# Patient Record
Sex: Female | Born: 1935 | Race: White | Hispanic: No | Marital: Single | Smoking: Never smoker
Health system: Southern US, Community
[De-identification: ages and names within clinical notes are randomized; demographics above are authoritative.]

## PROBLEM LIST (undated history)

## (undated) DIAGNOSIS — F329 Major depressive disorder, single episode, unspecified: Secondary | ICD-10-CM

## (undated) DIAGNOSIS — E7800 Pure hypercholesterolemia, unspecified: Secondary | ICD-10-CM

## (undated) DIAGNOSIS — E039 Hypothyroidism, unspecified: Secondary | ICD-10-CM

## (undated) DIAGNOSIS — D649 Anemia, unspecified: Secondary | ICD-10-CM

## (undated) DIAGNOSIS — F32A Depression, unspecified: Secondary | ICD-10-CM

## (undated) DIAGNOSIS — R0602 Shortness of breath: Secondary | ICD-10-CM

## (undated) DIAGNOSIS — I10 Essential (primary) hypertension: Secondary | ICD-10-CM

## (undated) DIAGNOSIS — F419 Anxiety disorder, unspecified: Secondary | ICD-10-CM

## (undated) HISTORY — PX: APPENDECTOMY: SHX54

## (undated) HISTORY — PX: ABDOMINAL HYSTERECTOMY: SHX81

## (undated) HISTORY — PX: OTHER SURGICAL HISTORY: SHX169

---

## 2012-08-29 ENCOUNTER — Inpatient Hospital Stay (HOSPITAL_COMMUNITY)
Admission: EM | Admit: 2012-08-29 | Discharge: 2012-09-01 | DRG: 175 | Disposition: A | Discharge status: Home Health | Payer: Medicare Other | Attending: Internal Medicine | Admitting: Internal Medicine

## 2012-08-29 ENCOUNTER — Inpatient Hospital Stay (HOSPITAL_COMMUNITY): Payer: Medicare Other

## 2012-08-29 ENCOUNTER — Encounter (HOSPITAL_COMMUNITY): Payer: Self-pay | Admitting: *Deleted

## 2012-08-29 ENCOUNTER — Emergency Department (HOSPITAL_COMMUNITY): Payer: Medicare Other

## 2012-08-29 DIAGNOSIS — I509 Heart failure, unspecified: Secondary | ICD-10-CM

## 2012-08-29 DIAGNOSIS — N182 Chronic kidney disease, stage 2 (mild): Secondary | ICD-10-CM | POA: Diagnosis present

## 2012-08-29 DIAGNOSIS — J9600 Acute respiratory failure, unspecified whether with hypoxia or hypercapnia: Secondary | ICD-10-CM | POA: Diagnosis present

## 2012-08-29 DIAGNOSIS — Z7901 Long term (current) use of anticoagulants: Secondary | ICD-10-CM

## 2012-08-29 DIAGNOSIS — M19019 Primary osteoarthritis, unspecified shoulder: Secondary | ICD-10-CM | POA: Diagnosis present

## 2012-08-29 DIAGNOSIS — D509 Iron deficiency anemia, unspecified: Secondary | ICD-10-CM | POA: Diagnosis present

## 2012-08-29 DIAGNOSIS — E039 Hypothyroidism, unspecified: Secondary | ICD-10-CM | POA: Diagnosis present

## 2012-08-29 DIAGNOSIS — Z79899 Other long term (current) drug therapy: Secondary | ICD-10-CM

## 2012-08-29 DIAGNOSIS — E785 Hyperlipidemia, unspecified: Secondary | ICD-10-CM | POA: Diagnosis present

## 2012-08-29 DIAGNOSIS — D649 Anemia, unspecified: Secondary | ICD-10-CM | POA: Diagnosis present

## 2012-08-29 DIAGNOSIS — R0600 Dyspnea, unspecified: Secondary | ICD-10-CM

## 2012-08-29 DIAGNOSIS — I10 Essential (primary) hypertension: Secondary | ICD-10-CM | POA: Diagnosis present

## 2012-08-29 DIAGNOSIS — F411 Generalized anxiety disorder: Secondary | ICD-10-CM | POA: Diagnosis present

## 2012-08-29 DIAGNOSIS — F329 Major depressive disorder, single episode, unspecified: Secondary | ICD-10-CM | POA: Diagnosis present

## 2012-08-29 DIAGNOSIS — I2699 Other pulmonary embolism without acute cor pulmonale: Secondary | ICD-10-CM | POA: Diagnosis present

## 2012-08-29 DIAGNOSIS — Z66 Do not resuscitate: Secondary | ICD-10-CM | POA: Diagnosis present

## 2012-08-29 DIAGNOSIS — R0609 Other forms of dyspnea: Secondary | ICD-10-CM | POA: Diagnosis present

## 2012-08-29 DIAGNOSIS — F3289 Other specified depressive episodes: Secondary | ICD-10-CM | POA: Diagnosis present

## 2012-08-29 HISTORY — DX: Hypothyroidism, unspecified: E03.9

## 2012-08-29 HISTORY — DX: Anemia, unspecified: D64.9

## 2012-08-29 HISTORY — DX: Major depressive disorder, single episode, unspecified: F32.9

## 2012-08-29 HISTORY — DX: Essential (primary) hypertension: I10

## 2012-08-29 HISTORY — DX: Depression, unspecified: F32.A

## 2012-08-29 HISTORY — DX: Shortness of breath: R06.02

## 2012-08-29 HISTORY — DX: Pure hypercholesterolemia, unspecified: E78.00

## 2012-08-29 HISTORY — DX: Anxiety disorder, unspecified: F41.9

## 2012-08-29 LAB — RETICULOCYTES
Retic Count, Absolute: 165.6 10*3/uL (ref 19.0–186.0)
Retic Ct Pct: 4.1 % — ABNORMAL HIGH (ref 0.4–3.1)

## 2012-08-29 LAB — CBC WITH DIFFERENTIAL/PLATELET
Basophils Relative: 0 % (ref 0–1)
Eosinophils Relative: 0 % (ref 0–5)
HCT: 29.2 % — ABNORMAL LOW (ref 36.0–46.0)
Hemoglobin: 8.4 g/dL — ABNORMAL LOW (ref 12.0–15.0)
Lymphocytes Relative: 7 % — ABNORMAL LOW (ref 12–46)
MCH: 20.2 pg — ABNORMAL LOW (ref 26.0–34.0)
Neutro Abs: 11 10*3/uL — ABNORMAL HIGH (ref 1.7–7.7)
Neutrophils Relative %: 86 % — ABNORMAL HIGH (ref 43–77)
RBC: 4.15 MIL/uL (ref 3.87–5.11)

## 2012-08-29 LAB — COMPREHENSIVE METABOLIC PANEL
ALT: 35 U/L (ref 0–35)
AST: 20 U/L (ref 0–37)
Alkaline Phosphatase: 87 U/L (ref 39–117)
CO2: 22 mEq/L (ref 19–32)
Chloride: 102 mEq/L (ref 96–112)
GFR calc non Af Amer: 43 mL/min — ABNORMAL LOW (ref >90)
Glucose, Bld: 149 mg/dL — ABNORMAL HIGH (ref 70–99)
Sodium: 139 mEq/L (ref 135–145)
Total Bilirubin: 0.5 mg/dL (ref 0.3–1.2)

## 2012-08-29 LAB — URINALYSIS, ROUTINE W REFLEX MICROSCOPIC
Glucose, UA: NEGATIVE mg/dL
Leukocytes, UA: NEGATIVE
Protein, ur: 30 mg/dL — AB
Specific Gravity, Urine: 1.013 (ref 1.005–1.030)
pH: 6 (ref 5.0–8.0)

## 2012-08-29 LAB — OCCULT BLOOD, POC DEVICE: Fecal Occult Bld: NEGATIVE

## 2012-08-29 LAB — URINE MICROSCOPIC-ADD ON

## 2012-08-29 MED ORDER — ACETAMINOPHEN 650 MG RE SUPP: 650.0000 mg | Freq: Four times a day (QID) | RECTAL | Status: DC | PRN

## 2012-08-29 MED ORDER — SODIUM CHLORIDE 0.9 % IJ SOLN
3.0000 mL | Freq: Two times a day (BID) | INTRAMUSCULAR | Status: DC
  Administered 2012-08-30: 3 mL via INTRAVENOUS

## 2012-08-29 MED ORDER — PAROXETINE HCL 10 MG PO TABS
10.0000 mg | ORAL_TABLET | Freq: Every day | ORAL | Status: DC
Start: 2012-08-30 — End: 2012-09-01
  Administered 2012-08-30 – 2012-09-01 (×3): 10 mg via ORAL
  Filled 2012-08-29 (×3): qty 1

## 2012-08-29 MED ORDER — IRBESARTAN 150 MG PO TABS
150.0000 mg | ORAL_TABLET | Freq: Every day | ORAL | Status: DC
  Administered 2012-08-30 – 2012-09-01 (×3): 150 mg via ORAL
  Filled 2012-08-29 (×3): qty 1

## 2012-08-29 MED ORDER — ONDANSETRON HCL 4 MG/2ML IJ SOLN: 4.0000 mg | Freq: Four times a day (QID) | INTRAMUSCULAR | Status: DC | PRN

## 2012-08-29 MED ORDER — LEVOTHYROXINE SODIUM 125 MCG PO TABS
62.5000 ug | ORAL_TABLET | Freq: Every day | ORAL | Status: DC
  Administered 2012-08-30 – 2012-09-01 (×3): 62.5 ug via ORAL
  Filled 2012-08-29 (×4): qty 0.5

## 2012-08-29 MED ORDER — DIPHENHYDRAMINE HCL 25 MG PO TABS
25.0000 mg | ORAL_TABLET | Freq: Every evening | ORAL | Status: DC | PRN
  Filled 2012-08-29: qty 1

## 2012-08-29 MED ORDER — ONDANSETRON HCL 4 MG PO TABS: 4.0000 mg | ORAL_TABLET | Freq: Four times a day (QID) | ORAL | Status: DC | PRN

## 2012-08-29 MED ORDER — ACETAMINOPHEN 325 MG PO TABS
650.0000 mg | ORAL_TABLET | Freq: Four times a day (QID) | ORAL | Status: DC | PRN
  Administered 2012-08-29 – 2012-08-31 (×2): 650 mg via ORAL
  Filled 2012-08-29 (×2): qty 2

## 2012-08-29 MED ORDER — ZOLPIDEM TARTRATE 5 MG PO TABS: 5.0000 mg | ORAL_TABLET | Freq: Every evening | ORAL | Status: DC | PRN

## 2012-08-29 MED ORDER — CELECOXIB 100 MG PO CAPS
100.0000 mg | ORAL_CAPSULE | Freq: Two times a day (BID) | ORAL | Status: DC | PRN
  Filled 2012-08-29: qty 1

## 2012-08-29 NOTE — ED Notes (Signed)
Pt reports going to her PCP last week, was told that her hemoglobin was low and needed a transfusion.  Pt refused.  Pt is pale, weak and SOB with exertion.  Pt denies pain.  Noted to have a large bruise on the (R) arm.

## 2012-08-29 NOTE — H&P (Signed)
Date: 08/29/2012               Patient Name:  Destiny Williams MRN: [id number]  DOB: December 28, 1921 Age / Sex: 78 y.o., female   PCP: No primary provider on file.              Medical Service: Internal Medicine Teaching Service              Attending Physician: Dr. Derwood Kaplan, MD    First Contact: Dr. Darden Palmer Pager: [phone]  Second Contact: Dr. Stacy Gardner Pager: [phone]            After Hours (After 5p/  First Contact Pager: [phone]  weekends / holidays): Second Contact Pager: [phone]     Chief Complaint: Shortness of breath  History of Present Illness: Patient is a 78 y.o. female with a PMHx of iron-deficiency anemia, HTN, HLD, hypothyroidism who presents to [facility] for evaluation of shortness of breath. The patient is from [place], [place] and is here in [place] visiting family for the holidays. Prior to leaving [place], the patient had seen her PCP approximately 2 weeks ago for new-onset SOB at which time she had a hemoglobin of 7.7 and her PCP recommended that she undergo blood transfusion for symptomatic anemia. Patient declined this option, however she reports to the ED today after urging of her family members for further evaluation of her shortness of breath. The patient states her shortness of breath is worse with standing and walking, and is relieved with sitting or lying down. Denies orthopnea or PND. Denies leg swelling. Denies chest pain. She admits to a history of hemorrhoids with occasional BRBPR, but denies any hematochezia or melena.  The patient states that she has known anemia of approximately 2 years, for which she was started on iron supplementation at that time but declined colonoscopy. She states she took the iron supplements for one year, and then discontinued them. She states her Hb was ~10 one year ago, at the time she stopped taking her PO iron (again, had decreased to 7.7 as of 2 weeks ago).   Patient states she had a TIA 20 years ago, and was  continued on Plavix for the last 20 years, which has only been discontinued as of approximately one week ago. Her only other complaint is a right shoulder bruise with associated pain x ~2 weeks, which the patient is unsure of how she acquired, and denies any recent history of trauma.  Review of Systems: Per HPI.  Current Outpatient Medications: No current facility-administered medications on file prior to encounter.   Current Outpatient Prescriptions on File Prior to Encounter  Medication Sig Dispense Refill  . diphenhydrAMINE (BENADRYL) 25 MG tablet Take 25 mg by mouth at bedtime.      Marland Kitchen eprosartan (TEVETEN) 600 MG tablet Take 600 mg by mouth daily.      Marland Kitchen levothyroxine (SYNTHROID, LEVOTHROID) 125 MCG tablet Take 62.5 mcg by mouth daily.      Marland Kitchen PARoxetine (PAXIL) 10 MG tablet Take 10 mg by mouth daily.        Allergies: Allergies  Allergen Reactions  . Codeine Nausea And Vomiting     Past Medical History: Past Medical History  Diagnosis Date  . HTN (hypertension)   . Thyroid disease   . Hypercholesteremia   . Depression   . Anxiety     Past Surgical History: Past Surgical History  Procedure Date  . Appendectomy   . Abdominal hysterectomy  Family History: History reviewed. No pertinent family history.  Social History: History   Social History  . Marital Status: Single    Spouse Name: N/A    Number of Children: N/A  . Years of Education: N/A   Occupational History  . Not on file.   Social History Main Topics  . Smoking status: Never Smoker   . Smokeless tobacco: Not on file  . Alcohol Use: Yes     Comment: socially  . Drug Use: No  . Sexually Active:    Other Topics Concern  . Not on file   Social History Narrative  . No narrative on file     Vital Signs: Blood pressure 114/63, pulse 107, temperature 98.6 F (37 C), resp. rate 20, SpO2 98.00%.  Physical Exam: General: Vital signs reviewed and noted. Well-developed, well-nourished, in no  acute distress; alert, appropriate and cooperative throughout examination.  Head: Normocephalic, atraumatic.  Eyes: PERRL, EOMI. Mild scleral icterus.  Nose: Mucous membranes moist, not inflammed, nonerythematous. [place] in place.  Throat: Oropharynx nonerythematous, no exudate appreciated.   Neck: No deformities, masses, or tenderness noted.Supple, No carotid Bruits, no JVD.  Lungs:  Normal respiratory effort. Clear to auscultation BL without crackles or wheezes.  Heart: Sinus tachycardia with occasional aberrancy. S1 and S2 normal without gallop, murmur, or rubs.  Abdomen:  BS normoactive. Soft, Nondistended, non-tender.  No masses or organomegaly.  Extremities: Resolving ecchymoses over R shoulder with no TTP. No pretibial edema.  Neurologic: A&O X3, CN II - XII are grossly intact. Motor strength is 5/5 in the all 4 extremities, Sensations intact to light touch, Cerebellar signs negative.  Skin: No visible rashes, scars.   Lab results: Comprehensive Metabolic Panel:    Component Value Date/Time   NA 139 08/29/2012 1516   K 3.5 08/29/2012 1516   CL 102 08/29/2012 1516   CO2 22 08/29/2012 1516   BUN 22 08/29/2012 1516   CREATININE 1.12* 08/29/2012 1516   GLUCOSE 149* 08/29/2012 1516   CALCIUM 9.2 08/29/2012 1516   AST 20 08/29/2012 1516   ALT 35 08/29/2012 1516   ALKPHOS 87 08/29/2012 1516   BILITOT 0.5 08/29/2012 1516   PROT 6.6 08/29/2012 1516   ALBUMIN 3.6 08/29/2012 1516    CBC:    Component Value Date/Time   WBC 12.8* 08/29/2012 1516   HGB 8.4* 08/29/2012 1516   HCT 29.2* 08/29/2012 1516   PLT 355 08/29/2012 1516   MCV 70.4* 08/29/2012 1516   NEUTROABS 11.0* 08/29/2012 1516   LYMPHSABS 0.9 08/29/2012 1516   MONOABS 0.9 08/29/2012 1516   EOSABS 0.0 08/29/2012 1516   BASOSABS 0.0 08/29/2012 1516   Imaging results:  Dg Chest 2 View  08/29/2012  *RADIOLOGY REPORT*  Clinical Data: 78 year old female with weakness and shortness of breath.  CHEST - 2 VIEW  Comparison:  None  Findings: Cardiomegaly is identified. A moderate to large hiatal hernia is present. Left basilar atelectasis/scarring is noted.  There is no evidence of focal airspace disease, pulmonary edema, suspicious pulmonary nodule/mass, pleural effusion, or pneumothorax. No acute bony abnormalities are identified.  IMPRESSION: Cardiomegaly with left basilar atelectasis/scarring.  Moderate to large hiatal hernia.   Original Report Authenticated By: Harmon Pier, M.D.     Assessment & Plan:  Pt is a 78 y.o. yo female with a PMHx of iron-deficiency anemia, HTN, HLD, hypothyroidism who was admitted on 08/29/2012 with symptoms of SOB, which was determined to be secondary to anemia. Interventions at this time will be focused  on transfusing the patient to improve her anemia-related symptoms, which she states are limiting her independent functioning.    Anemia - likely iron deficiency anemia per patient's provided history. Source of the patient's anemia is unclear, with the only identifiable source at present being her reported hemorrhoids with associated BRBPR. She states she had a normal colonoscopy in the past, that she is due for a routine screening colonoscopy and refused her last one 2 years ago. Given the patient has a worsening anemia of unknown origin, she is definitely in need of a colonoscopy at this time, her is to be performed in the outpatient setting. As the patient is symptomatic with a hemoglobin of approximately 8, transfusion will be pursued at this time. - admit to floor on tele - type and screen -Transfuse 1 unit PRBCs -Anemia panel -Monitor CBCs  R shoulder pain - patient has ecchymoses in the area but denies any trauma. As the bruises were obtained 1-2 weeks ago, just prior to stopping plavix, it is possible she may have had a very minor trauma causing the ecchymoses. As she has pain with movement, however, fracture will need to be r/o despite no knowledge of trauma.  - XR R  shoulder  Elevated proBNP - non-diagnostic and likely falsely elevated given pt's GFR <60 and advanced age. No signs/symptoms of CHF, patient is CTA on exam and no pulmonary edema on CXR. SOB is better explained by anemia given exertional component and no orthopnea/PND. Pt denies previous history of CHF.  - consider echo if symptoms unresolved following transfusion  HTN - well-controlled. - cont ARB  HLD - patient states she was recommended to start a statin by her PCP, but declined this intervention.  Hypothyroidism - on synthroid.  - check TSH (no records on file)    DVT PPX - SCD's while in bed  CODE STATUS - full  CONSULTS PLACED - N/A  DISPO - Disposition is deferred at this time, awaiting improvement of SOB.   Anticipated discharge in approximately 1 day(s).   The patient does have a current PCP (No primary provider on file.) and is not requiring OPC follow-up after discharge.   The patient does not have transportation limitations that hinder transportation to clinic appointments.   Services Needed at time of discharge: TO BE DETERMINED DURING HOSPITAL COURSE         Y = Yes, Blank = No PT:   OT:   RN:   Equipment:   Other:     Signed: Elfredia Nevins, MD  PGY-1, Internal Medicine Resident Pager: [phone]) 08/29/2012, 9:14 PM

## 2012-08-29 NOTE — ED Provider Notes (Signed)
History     CSN: [id number]  Arrival date & time 08/29/12  1447   First MD Initiated Contact with Patient 08/29/12 1908      Chief Complaint  Patient presents with  . Weakness    (Consider location/radiation/quality/duration/timing/severity/associated sxs/prior treatment) Patient is a 78 y.o. female presenting with general illness. The history is provided by the patient. No language interpreter was used.  Illness  The current episode started more than 1 week ago. The problem occurs continuously. The problem has been gradually worsening. The problem is moderate. Nothing relieves the symptoms. Nothing aggravates the symptoms. Pertinent negatives include no fever, no abdominal pain, no constipation, no diarrhea, no nausea, no vomiting, no congestion, no headaches, no sore throat, no cough and no rash.    Past Medical History  Diagnosis Date  . HTN (hypertension)   . Thyroid disease   . Hypercholesteremia   . Depression   . Anxiety     Past Surgical History  Procedure Date  . Appendectomy   . Abdominal hysterectomy     History reviewed. No pertinent family history.  History  Substance Use Topics  . Smoking status: Never Smoker   . Smokeless tobacco: Not on file  . Alcohol Use: Yes     Comment: socially    OB History    Grav Para Term Preterm Abortions TAB SAB Ect Mult Living                  Review of Systems  Constitutional: Negative for fever and chills.  HENT: Negative for congestion and sore throat.   Respiratory: Positive for shortness of breath. Negative for cough.   Cardiovascular: Negative for chest pain and leg swelling.  Gastrointestinal: Negative for nausea, vomiting, abdominal pain, diarrhea and constipation.  Genitourinary: Negative for dysuria and frequency.  Skin: Negative for color change and rash.  Neurological: Negative for dizziness and headaches.  Psychiatric/Behavioral: Negative for confusion and agitation.  All other systems reviewed and  are negative.    Allergies  Codeine  Home Medications  No current outpatient prescriptions on file.  BP 114/63  Pulse 107  Temp 98.6 F (37 C)  Resp 20  SpO2 98%  Physical Exam  Vitals reviewed. Constitutional: She is oriented to person, place, and time. She appears well-developed and well-nourished. No distress.  HENT:  Head: Normocephalic and atraumatic.  Mouth/Throat: Mucous membranes are pale.  Eyes: EOM are normal. Pupils are equal, round, and reactive to light.  Neck: Normal range of motion. Neck supple. No JVD present.  Cardiovascular: Normal rate and regular rhythm.   Pulmonary/Chest: Effort normal and breath sounds normal. No respiratory distress.  Abdominal: Soft. She exhibits no distension.  Musculoskeletal: Normal range of motion. She exhibits no edema.       Right lower leg: She exhibits no swelling and no edema.       Left lower leg: She exhibits no swelling and no edema.  Neurological: She is alert and oriented to person, place, and time.  Skin: Skin is warm and dry. There is pallor.  Psychiatric: She has a normal mood and affect. Her behavior is normal.    ED Course  Procedures (including critical care time)  Results for orders placed during the hospital encounter of 08/29/12  CBC WITH DIFFERENTIAL      Component Value Range   WBC 12.8 (*) 4.0 - 10.5 K/uL   RBC 4.15  3.87 - 5.11 MIL/uL   Hemoglobin 8.4 (*) 12.0 - 15.0 g/dL  HCT 29.2 (*) 36.0 - 46.0 %   MCV 70.4 (*) 78.0 - 100.0 fL   MCH 20.2 (*) 26.0 - 34.0 pg   MCHC 28.8 (*) 30.0 - 36.0 g/dL   RDW 16.1 (*) 09.6 - 04.5 %   Platelets 355  150 - 400 K/uL   Neutrophils Relative 86 (*) 43 - 77 %   Lymphocytes Relative 7 (*) 12 - 46 %   Monocytes Relative 7  3 - 12 %   Eosinophils Relative 0  0 - 5 %   Basophils Relative 0  0 - 1 %   Neutro Abs 11.0 (*) 1.7 - 7.7 K/uL   Lymphs Abs 0.9  0.7 - 4.0 K/uL   Monocytes Absolute 0.9  0.1 - 1.0 K/uL   Eosinophils Absolute 0.0  0.0 - 0.7 K/uL   Basophils  Absolute 0.0  0.0 - 0.1 K/uL   RBC Morphology POLYCHROMASIA PRESENT     WBC Morphology TOXIC GRANULATION    COMPREHENSIVE METABOLIC PANEL      Component Value Range   Sodium 139  135 - 145 mEq/L   Potassium 3.5  3.5 - 5.1 mEq/L   Chloride 102  96 - 112 mEq/L   CO2 22  19 - 32 mEq/L   Glucose, Bld 149 (*) 70 - 99 mg/dL   BUN 22  6 - 23 mg/dL   Creatinine, Ser 4.09 (*) 0.50 - 1.10 mg/dL   Calcium 9.2  8.4 - 81.1 mg/dL   Total Protein 6.6  6.0 - 8.3 g/dL   Albumin 3.6  3.5 - 5.2 g/dL   AST 20  0 - 37 U/L   ALT 35  0 - 35 U/L   Alkaline Phosphatase 87  39 - 117 U/L   Total Bilirubin 0.5  0.3 - 1.2 mg/dL   GFR calc non Af Amer 43 (*) >90 mL/min   GFR calc Af Amer 49 (*) >90 mL/min  PROTIME-INR      Component Value Range   Prothrombin Time 15.6 (*) 11.6 - 15.2 seconds   INR 1.27  0.00 - 1.49  APTT      Component Value Range   aPTT 32  24 - 37 seconds  PRO B NATRIURETIC PEPTIDE      Component Value Range   Pro B Natriuretic peptide (BNP) 2982.0 (*) 0 - 450 pg/mL  TROPONIN I      Component Value Range   Troponin I <0.30  <0.30 ng/mL  URINALYSIS, ROUTINE W REFLEX MICROSCOPIC      Component Value Range   Color, Urine YELLOW  YELLOW   APPearance CLEAR  CLEAR   Specific Gravity, Urine 1.013  1.005 - 1.030   pH 6.0  5.0 - 8.0   Glucose, UA NEGATIVE  NEGATIVE mg/dL   Hgb urine dipstick NEGATIVE  NEGATIVE   Bilirubin Urine NEGATIVE  NEGATIVE   Ketones, ur NEGATIVE  NEGATIVE mg/dL   Protein, ur 30 (*) NEGATIVE mg/dL   Urobilinogen, UA 0.2  0.0 - 1.0 mg/dL   Nitrite NEGATIVE  NEGATIVE   Leukocytes, UA NEGATIVE  NEGATIVE  OCCULT BLOOD, POC DEVICE      Component Value Range   Fecal Occult Bld NEGATIVE  NEGATIVE  RETICULOCYTES      Component Value Range   Retic Ct Pct 4.1 (*) 0.4 - 3.1 %   RBC. 4.04  3.87 - 5.11 MIL/uL   Retic Count, Manual 165.6  19.0 - 186.0 K/uL  TYPE AND SCREEN  Component Value Range   ABO/RH(D) O POS     Antibody Screen NEG     Sample Expiration  09/01/2012     Unit Number [id number]     Blood Component Type RED CELLS,LR     Unit division 00     Status of Unit ISSUED     Transfusion Status OK TO TRANSFUSE     Crossmatch Result Compatible    PREPARE RBC (CROSSMATCH)      Component Value Range   Order Confirmation ORDER PROCESSED BY BLOOD BANK    ABO/RH      Component Value Range   ABO/RH(D) O POS    URINE MICROSCOPIC-ADD ON      Component Value Range   Squamous Epithelial / LPF FEW (*) RARE   WBC, UA 0-2  <3 WBC/hpf   Bacteria, UA RARE  RARE   DG Shoulder Right (Final result)   Result time:08/29/12 2226    Final result by Rad Results In Interface (08/29/12 22:26:01)    Narrative:   *RADIOLOGY REPORT*  Clinical Data: Right shoulder pain and bruising; question of fall.  RIGHT SHOULDER - 2+ VIEW  Comparison: None.  Findings: There is no evidence of fracture or dislocation. The right humeral head is seated within the glenoid fossa, though there is superior subluxation of the humeral head, with associated mild cortical irregularity and mild bony remodelling of the right acromion. Osteophytes are seen arising from the medial humeral head.  The acromioclavicular joint is unremarkable in appearance. No significant soft tissue abnormalities are seen. The visualized portions of the right lung are clear.  IMPRESSION:  1. No evidence of fracture or dislocation. 2. Chronic degenerative changes noted at the right glenohumeral joint, with superior subluxation of the right humeral head, reflecting remote rotator cuff injury.   Original Report Authenticated By: Tonia Ghent, M.D.             DG Chest 2 View (Final result)   Result time:08/29/12 2037    Final result by Rad Results In Interface (08/29/12 20:37:21)    Narrative:   *RADIOLOGY REPORT*  Clinical Data: 78 year old female with weakness and shortness of breath.  CHEST - 2 VIEW  Comparison: None  Findings: Cardiomegaly is identified. A  moderate to large hiatal hernia is present. Left basilar atelectasis/scarring is noted.  There is no evidence of focal airspace disease, pulmonary edema, suspicious pulmonary nodule/mass, pleural effusion, or pneumothorax. No acute bony abnormalities are identified.  IMPRESSION: Cardiomegaly with left basilar atelectasis/scarring.  Moderate to large hiatal hernia.   Original Report Authenticated By: Harmon Pier, M.D.     No results found.   No diagnosis found.    MDM  Pt w/ PMHx of HTN, HLD and hypothyroidism now w/ dyspnea. States increasing dyspnea over the past several wks, was seen by pcp and noted to be anemic w/ hgb 7.7 - denies melena. Admits to DOE, 2 pillow orthopnea. Denies chest pain. No hx of CHF, PE or CAD. In triage sats 89% - now 98% on 2L [place], normotensive, pale conjunctiva and mucus membranes. No JVD or LE edema, lungs CTAB. ECG - NSR no acute ischemia, CXR - w/ cardiomegaly - no pulm edema, BNP 2982, troponin neg, hgb 8.4, hemoccult neg. D/w internal med and pt admitted for new onset CHF and anemia. Admit in stable condition.   1. Dyspnea   2. CHF (congestive heart failure)   3. Anemia         Audelia Hives, MD 08/29/12 [phone]

## 2012-08-30 ENCOUNTER — Inpatient Hospital Stay (HOSPITAL_COMMUNITY): Payer: Medicare Other

## 2012-08-30 ENCOUNTER — Encounter (HOSPITAL_COMMUNITY): Payer: Self-pay | Admitting: *Deleted

## 2012-08-30 DIAGNOSIS — N182 Chronic kidney disease, stage 2 (mild): Secondary | ICD-10-CM | POA: Diagnosis present

## 2012-08-30 DIAGNOSIS — R0989 Other specified symptoms and signs involving the circulatory and respiratory systems: Secondary | ICD-10-CM

## 2012-08-30 DIAGNOSIS — I059 Rheumatic mitral valve disease, unspecified: Secondary | ICD-10-CM

## 2012-08-30 LAB — BLOOD GAS, ARTERIAL
Acid-Base Excess: 0.2 mmol/L (ref 0.0–2.0)
Bicarbonate: 22.1 mEq/L (ref 20.0–24.0)
FIO2: 0.21 %
O2 Saturation: 100 %
Patient temperature: 98.6
TCO2: 23 mmol/L (ref 0–100)
TCO2: 23.7 mmol/L (ref 0–100)
pCO2 arterial: 29.4 mmHg — ABNORMAL LOW (ref 35.0–45.0)
pCO2 arterial: 28.3 mmHg — ABNORMAL LOW (ref 35.0–45.0)
pH, Arterial: 7.489 — ABNORMAL HIGH (ref 7.350–7.450)
pO2, Arterial: 62 mmHg — ABNORMAL LOW (ref 80.0–100.0)
pO2, Arterial: 352 mmHg — ABNORMAL HIGH (ref 80.0–100.0)

## 2012-08-30 LAB — BASIC METABOLIC PANEL
CO2: 25 mEq/L (ref 19–32)
Calcium: 8.8 mg/dL (ref 8.4–10.5)
Creatinine, Ser: 0.89 mg/dL (ref 0.50–1.10)
GFR calc Af Amer: 65 mL/min — ABNORMAL LOW (ref >90)
GFR calc non Af Amer: 56 mL/min — ABNORMAL LOW (ref >90)
Sodium: 139 mEq/L (ref 135–145)

## 2012-08-30 LAB — TYPE AND SCREEN

## 2012-08-30 LAB — MRSA PCR SCREENING: MRSA by PCR: NEGATIVE

## 2012-08-30 LAB — HEPARIN LEVEL (UNFRACTIONATED): Heparin Unfractionated: 0.39 IU/mL (ref 0.30–0.70)

## 2012-08-30 LAB — CBC
MCH: 21.5 pg — ABNORMAL LOW (ref 26.0–34.0)
MCHC: 29.6 g/dL — ABNORMAL LOW (ref 30.0–36.0)
MCV: 71.8 fL — ABNORMAL LOW (ref 78.0–100.0)
MCV: 72.7 fL — ABNORMAL LOW (ref 78.0–100.0)
Platelets: 260 10*3/uL (ref 150–400)
Platelets: 273 10*3/uL (ref 150–400)
RBC: 4.12 MIL/uL (ref 3.87–5.11)
RBC: 4.28 MIL/uL (ref 3.87–5.11)
RDW: 24.6 % — ABNORMAL HIGH (ref 11.5–15.5)
WBC: 12.4 10*3/uL — ABNORMAL HIGH (ref 4.0–10.5)

## 2012-08-30 LAB — IRON AND TIBC
Iron: 10 ug/dL — ABNORMAL LOW (ref 42–135)
UIBC: 379 ug/dL (ref 125–400)

## 2012-08-30 LAB — TSH: TSH: 2.017 u[IU]/mL (ref 0.350–4.500)

## 2012-08-30 LAB — FERRITIN: Ferritin: 17 ng/mL (ref 10–291)

## 2012-08-30 LAB — LACTATE DEHYDROGENASE: LDH: 243 U/L (ref 94–250)

## 2012-08-30 MED ORDER — IOHEXOL 350 MG/ML SOLN
100.0000 mL | Freq: Once | INTRAVENOUS | Status: AC | PRN
  Administered 2012-08-30: 100 mL via INTRAVENOUS

## 2012-08-30 MED ORDER — WARFARIN VIDEO
Freq: Once | Status: AC
  Administered 2012-08-30: 14:00:00

## 2012-08-30 MED ORDER — WARFARIN SODIUM 5 MG PO TABS
5.0000 mg | ORAL_TABLET | Freq: Once | ORAL | Status: AC
  Administered 2012-08-30: 5 mg via ORAL
  Filled 2012-08-30: qty 1

## 2012-08-30 MED ORDER — HEPARIN (PORCINE) IN NACL 100-0.45 UNIT/ML-% IJ SOLN
1150.0000 [IU]/h | INTRAMUSCULAR | Status: DC
  Administered 2012-08-30: 1100 [IU]/h via INTRAVENOUS
  Administered 2012-08-31 – 2012-09-01 (×2): 1150 [IU]/h via INTRAVENOUS
  Filled 2012-08-30 (×5): qty 250

## 2012-08-30 MED ORDER — HEPARIN BOLUS VIA INFUSION
3500.0000 [IU] | Freq: Once | INTRAVENOUS | Status: AC
  Administered 2012-08-30: 3500 [IU] via INTRAVENOUS
  Filled 2012-08-30: qty 3500

## 2012-08-30 MED ORDER — FERROUS SULFATE 325 (65 FE) MG PO TABS
325.0000 mg | ORAL_TABLET | Freq: Two times a day (BID) | ORAL | Status: DC
  Administered 2012-08-31 – 2012-09-01 (×3): 325 mg via ORAL
  Filled 2012-08-30 (×5): qty 1

## 2012-08-30 MED ORDER — POTASSIUM CHLORIDE CRYS ER 20 MEQ PO TBCR
40.0000 meq | EXTENDED_RELEASE_TABLET | Freq: Once | ORAL | Status: AC
  Administered 2012-08-30: 40 meq via ORAL
  Filled 2012-08-30: qty 2

## 2012-08-30 MED ORDER — PATIENT'S GUIDE TO USING COUMADIN BOOK
Freq: Once | Status: AC
  Administered 2012-08-30: 14:00:00
  Filled 2012-08-30: qty 1

## 2012-08-30 MED ORDER — WARFARIN - PHARMACIST DOSING INPATIENT
Freq: Every day | Status: DC
  Administered 2012-08-31: 18:00:00

## 2012-08-30 NOTE — Progress Notes (Addendum)
ANTICOAGULATION CONSULT NOTE - Initial Consult  Pharmacy Consult for Heparin Indication: R/O pulmonary embolus  Allergies  Allergen Reactions  . Codeine Nausea And Vomiting    Patient Measurements: Height: 5\' 4"  (162.6 cm) Weight: 151 lb 9.6 oz (68.765 kg) IBW/kg (Calculated) : 54.7  Heparin Dosing Weight: 68 kg  Vital Signs: Temp: 98.3 F (36.8 C) (12/27 1124) Temp src: Oral (12/27 1124) BP: 129/71 mmHg (12/27 1124) Pulse Rate: 121  (12/27 1124)  Labs:  Basename 08/30/12 1051 08/30/12 0435 08/29/12 1931 08/29/12 1523 08/29/12 1516  HGB 9.2* 8.8* -- -- --  HCT 31.1* 29.6* -- -- 29.2*  PLT 273 260 -- -- 355  APTT -- -- -- 32 --  LABPROT -- -- -- 15.6* --  INR -- -- -- 1.27 --  HEPARINUNFRC -- -- -- -- --  CREATININE -- 0.89 -- -- 1.12*  CKTOTAL -- -- -- -- --  CKMB -- -- -- -- --  TROPONINI -- -- <0.30 -- --    Estimated Creatinine Clearance: 41.6 ml/min (by C-G formula based on Cr of 0.89).   Medical History: Past Medical History  Diagnosis Date  . HTN (hypertension)   . Hypothyroidism   . Hypercholesteremia   . Depression   . Anxiety      Assessment: 56 YOF admitted on 12/26 for SOB, which was initial thought due to anemia (hgb 8.4), however SOB worsened after PRBC, now concerning for PE, pharmacy is consulted to start IV heparin. CTA results pending. Pt. Does not take anticoagulants prior to admission. hgb 9.2, plt 173. Baseline aPTT 32  Goal of Therapy:  Heparin level 0.3-0.7 units/ml Monitor platelets by anticoagulation protocol: Yes   Plan:   - Heparin bolus 3500 units x 1 - Heparin infusion 1100 units/hr - 8 hr heparin level at 2200 - Daily cbc and heparin level with AM labs - F/u CT angio results.  Bayard Hugger, PharmD, BCPS  Clinical Pharmacist  Pager: [phone]  08/30/2012,1:09 PM   Addendum:  CT angio - Multiple bilateral segmental pulmonary emboli Will add coumadin per pharmacy  Baseline INR 1.27  Goal: INR 2-3  Plan:  -  Coumadin 5mg  PO x 1 - Daily PT/INR - Coumadin education book and video - Coumadin education with pharmacist  Bayard Hugger, PharmD, BCPS  Clinical Pharmacist  Pager: [phone]

## 2012-08-30 NOTE — Progress Notes (Signed)
  Echocardiogram 2D Echocardiogram has been performed.  Destiny Williams 08/30/2012, 1:00 PM

## 2012-08-30 NOTE — Progress Notes (Signed)
o2 sat 90% on RA ambulating to BR and back.  Dyspneic.

## 2012-08-30 NOTE — Progress Notes (Signed)
Utilization review completed.  

## 2012-08-30 NOTE — H&P (Signed)
Internal Medicine Teaching Service Attending Note Date: 08/30/2012  Patient name: Destiny Williams  Medical record number: [id number]  Date of birth: Jun 24, 1922   I have seen and evaluated Donia Pounds and discussed their care with the Residency Team. Please see Dr McTyre's H&P for full details. Ms Baade is in [place]. For the past 6 months she has had a gradual onset of DOE. For the past two weeks, it has really progressed and the pt is no longer independent and needs assistance with ADLs. She saw her PCP about 2 weeks ago and her HgB was found to be 7.7 and a PRBC transfusion was rec but pt declined. She resumed her FeSO4 about 1-2 weeks ago.   She has a h/o Anemia, Fe def, for about the past 2 yrs and was on FeSO4 with her HgB increasing to about 10. She has had a colonoscopy in the past but refused a more recent rec to repeat it.   She came to the ER for increasing SOB and complete reliance on her family for ADL's. She is not dyspneic at rest but does with the slightest movement. She has had no CP, palp, LE edema, personal or fam hx of VTE, personal hx cardiac or pul dz, personal h/o murmur. 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 She has had no recent surgery, cast, immobilization. She has had no syncope, lightheadedness.   PMHx, meds, allergies, soc hx, fam hx, and ROS were reviewed.  Gen : pt is in mild resp distress. She is using accessory muscles to breathe and has audible breath sounds. She is tachypnic. She desated to mid 80's on 2 L and responded to increased O2 by [place].  H tachy and regular. No murmur appreciated. LCTAB Ext no edema Skin no rash Neuro : no focal  Labs and imaging were reviewed  Assessment and Plan: I agree with the formulated Assessment and Plan with the following changes:   1. Acute Resp Failure - The differential is broad. She is currently stable and appropriate for a regular bed. She does require  supplemental O2. Diagnotic tests have been ordered stat to facilitate timely dx and tx.    Anemia - She does have anemia but her sxs are out of proportion to her degree of anemia. She has been transfused 1 unit but didn't have an appropriate rise in her HgB. Therefore, additional test to R/O hemolysis have been ordered. No additional PRBC until we know her cardiac status better. She can cont her FeSO4 and get additional GI W/U as an outpt as long as she remains stable. No sig GI bleed - hemocult negative.   Cardiac - There is concern for aortic stenosis and a STAT ECHO has been ordered. She has no murmur but could be obscured by her tachycardia. Her lungs and CXR are clear, her elevated BNP can be explained by her CRF, so I doubt pul edema and will not diurese at this time. Trop I was negative on admission and sxs have been prolonged and EKG nl. Therefore, cardiac ischemia as the sole dx unlikely.    Pulmonary - Everitt Amber is moderate risk. Will start with ABG and check A-a gradient. Will preceded to CT chest to R/O PE if cannot get other studies in timely manner or results are concerning. CT will also show lung parenchyma. Will not dose full dose lovenox at this time 2/2 anemia and there are other dx that could explain  the sxs.    Infectious - CXR not c/w PNA. No fever but ABC are increased. Doubt infectious etiology but can repeat CXR if spikes temp  Pt needs close F/U and rapid diagnotic testing. Will be here for several days.        Burns Spain, MD 12/27/201310:48 AM

## 2012-08-30 NOTE — Progress Notes (Signed)
Admitted patient from ED per wheelchair assisted with [facility] nurse from ED. Assessed pt and having a shortness of breath with O2 at 2L/m , denies any chest pain. Discussed about doing the orthostatic vital signs measurement, patient's guide book given. Orthostatic BP/HR measurements as follows ; 2300 Patient supine BP/HR= 127/66 (93)  Left arm no S/S compromise noted 2302 Patient sitting,legs dangling BP/HR= 117/74 (102) Left arm ,appears pale 2305 Patient standing with my assist BP/HR=134/72 (110) Left arm, appears pale and a little dizzy 2308 Patient standing with my assist BP/HR= 128/66 (107) Left arm, appears pale and a little dizzy

## 2012-08-30 NOTE — Progress Notes (Addendum)
Subjective: [name] was seen and examined at bedside.  She is a very pleasant lady with her family in the room as well.  She is currently comfortably and lying in bed.  Noted to be short of breath at rest and desat ~85 on room air but up to 96-98% on [place] oxygen 2L.  She denies any headaches, chest pain, N/V/D, fever, chills, abdominal pain, leg pain, or any urinary complaints at this time.    Objective: Vital signs in last 24 hours: Filed Vitals:   08/30/12 0100 08/30/12 0200 08/30/12 0245 08/30/12 0600  BP: 122/53 119/63 124/66 137/81  Pulse: 97 95 90 100  Temp: 98.8 F (37.1 C) 98.9 F (37.2 C) 98.6 F (37 C) 98.2 F (36.8 C)  TempSrc: Oral Oral Oral Oral  Resp: 18 18 18 18   Height:      Weight:    151 lb 9.6 oz (68.765 kg)  SpO2:    98%   Weight change:   Intake/Output Summary (Last 24 hours) at 08/30/12 0956 Last data filed at 08/30/12 0952  Gross per 24 hour  Intake  902.5 ml  Output    851 ml  Net   51.5 ml   Vitals reviewed. General: resting in bed, NAD HEENT: PERRLA, EOMI, no scleral icterus Cardiac: Tachycardia, no rubs, murmurs or gallops Pulm: clear to auscultation bilaterally, no wheezes, rales, or rhonchi Abd: soft, nontender, nondistended, BS present Ext: warm and well perfused, no pedal edema, scd's in place, +2 dp b/l Neuro: alert and oriented X3, cranial nerves II-XII grossly intact, strength and sensation to light touch equal in bilateral upper and lower extremities Lab Results: Basic Metabolic Panel:  Lab 08/30/12 1610 08/29/12 1516  NA 139 139  K 3.4* 3.5  CL 104 102  CO2 25 22  GLUCOSE 114* 149*  BUN 17 22  CREATININE 0.89 1.12*  CALCIUM 8.8 9.2  MG -- --  PHOS -- --   Liver Function Tests:  Lab 08/29/12 1516  AST 20  ALT 35  ALKPHOS 87  BILITOT 0.5  PROT 6.6  ALBUMIN 3.6   CBC:  Lab 08/30/12 0435 08/29/12 1516  WBC 12.4* 12.8*  NEUTROABS -- 11.0*  HGB 8.8* 8.4*  HCT 29.6* 29.2*  MCV 71.8* 70.4*  PLT 260 355   Cardiac  Enzymes:  Lab 08/29/12 1931  CKTOTAL --  CKMB --  CKMBINDEX --  TROPONINI <0.30   BNP:  Lab 08/29/12 1931  PROBNP 2982.0*   Thyroid Function Tests:  Lab 08/29/12 2127  TSH 2.017  T4TOTAL --  FREET4 --  T3FREE --  THYROIDAB --   Coagulation:  Lab 08/29/12 1523  LABPROT 15.6*  INR 1.27   Anemia Panel:  Lab 08/29/12 2127  VITAMINB12 640  FOLATE >20.0  FERRITIN 17  TIBC 389  IRON 10*  RETICCTPCT 4.1*   Urinalysis:  Lab 08/29/12 2312  COLORURINE YELLOW  LABSPEC 1.013  PHURINE 6.0  GLUCOSEU NEGATIVE  HGBUR NEGATIVE  BILIRUBINUR NEGATIVE  KETONESUR NEGATIVE  PROTEINUR 30*  UROBILINOGEN 0.2  NITRITE NEGATIVE  LEUKOCYTESUR NEGATIVE   Micro Results: Recent Results (from the past 240 hour(s))  MRSA PCR SCREENING     Status: Normal   Collection Time   08/29/12 11:02 PM      Component Value Range Status Comment   MRSA by PCR NEGATIVE  NEGATIVE Final    Studies/Results: Dg Chest 2 View  08/29/2012  *RADIOLOGY REPORT*  Clinical Data: 78 year old female with weakness and shortness of breath.  CHEST - 2 VIEW  Comparison: None  Findings: Cardiomegaly is identified. A moderate to large hiatal hernia is present. Left basilar atelectasis/scarring is noted.  There is no evidence of focal airspace disease, pulmonary edema, suspicious pulmonary nodule/mass, pleural effusion, or pneumothorax. No acute bony abnormalities are identified.  IMPRESSION: Cardiomegaly with left basilar atelectasis/scarring.  Moderate to large hiatal hernia.   Original Report Authenticated By: Harmon Pier, M.D.    Dg Shoulder Right  08/29/2012  *RADIOLOGY REPORT*  Clinical Data: Right shoulder pain and bruising; question of fall.  RIGHT SHOULDER - 2+ VIEW  Comparison: None.  Findings: There is no evidence of fracture or dislocation.  The right humeral head is seated within the glenoid fossa, though there is superior subluxation of the humeral head, with associated mild cortical irregularity and mild  bony remodelling of the right acromion.  Osteophytes are seen arising from the medial humeral head.  The acromioclavicular joint is unremarkable in appearance.  No significant soft tissue abnormalities are seen.  The visualized portions of the right lung are clear.  IMPRESSION:  1.  No evidence of fracture or dislocation. 2.  Chronic degenerative changes noted at the right glenohumeral joint, with superior subluxation of the right humeral head, reflecting remote rotator cuff injury.   Original Report Authenticated By: Tonia Ghent, M.D.    Medications: I have reviewed the patient's current medications. Scheduled Meds:   . irbesartan  150 mg Oral Daily  . levothyroxine  62.5 mcg Oral QAC breakfast  . PARoxetine  10 mg Oral Daily  . sodium chloride  3 mL Intravenous Q12H   Continuous Infusions:  PRN Meds:.acetaminophen, acetaminophen, celecoxib, ondansetron (ZOFRAN) IV, ondansetron, zolpidem Assessment/Plan: [name] is a 78 yo female with a PMHx of iron-deficiency anemia, HTN, HLD, hypothyroidism admitted for worsening SOB.    SOB--unknown etiology at this time, worse on exertion and now at rest.  Was seen by pcp two weeks prior for SOB and found to have Hb 7.7 but was not transfused at that time.  Claims to have significant history of anemia in the past.  Noted to desat 80s on room air but saturating well on [place] o2.  Possible aortic stenosis vs. PE (tachycardia, worsening shortness of breath, modified geneva score 6--intermediate) vs. CHF (elevated proBNP on admission 2982 but in setting of CKD and no edema, orthopnea, or PND).  PNA seems unlikely given no signs of infiltrate on cxr, afebrile, and CTA on physical exam, however leukocytosis is present (wbc 12.8 with neut # 11).   -wbc trending down 12.4 today -continuous pulse oximetry -oxygen therapy with o2 sat >92% -f/u echo -f/u abg -consider CT angio  Anemia -possibly iron deficiency anemia vs. Hemolysis?. Source of anemia unclear at  this time, with the only identifiable source at present being her reported hemorrhoids with associated BRBPR.  Claims Hb was ~10 one year prior and Hb 7.7 two weeks prior with PCP but not transfused at that time.  Was on iron supplementation for one year 2 years ago and Plavix for TIA was recently discontinued as well.  She states she had a normal colonoscopy in the past, that she is due for a routine screening colonoscopy and refused her last one 2 years ago.FOBT on admission negative.  She was transfused 1 units PRBCs on admission 08/29/12.   -Hb 8.4-->8.8 (after 1 unit PRBC transfusion) -RDI:1.3%--inadequate bone marrow response to anemia -differential on admission: polychromasia with 2-5 schistocytes and elliptocytes, retic pct 4.1, rbc 4.04, retic count 165.6 -  Anemia panel: Iron 10, TIBC 389, sat ratio 3, uibc 379  -start ferrous sulfate 325mg  po bid tomorrow -Monitor CBCs  -f/u haptoglobin and LDH -consider colonoscopy--likely as outpatient  R shoulder pain - patient has ecchymoses in the area but denies any trauma. As the bruises were obtained 1-2 weeks ago, just prior to stopping plavix, it is possible she may have had a very minor trauma causing the ecchymoses. As she has pain with movement, however, fracture will need to be r/o despite no knowledge of trauma.  - XR R shoulder--no evidence of fracture or dislocation.  Chronic degenerative changes noted at right glenohumeral joint with superior subluxation of right humeral head, reflecting remote rotator cuff injry -f/u pcp and possibly ortho as an outpatient -pain control  Elevated proBNP - non-diagnostic at this time and possibly falsely elevated given pt's GFR <60 and advanced age.   SOB at rest and worse with exertion but denies orthopnea, PND, and cxr on admission shows cardiomegaly with left basilar atelectasis/scarring. She denies previous history of CHF.  -f/u echo  HTN - well-controlled.  - cont ARB   HLD - states she was  recommended to start a statin by her PCP in the past, but declined this intervention. -continue to monitor   Hypothyroidism - on synthroid.  - TSH 2.017  DVT PPX - SCD's while in bed  CODE STATUS - full  CONSULTS PLACED - N/A  DISPO - Disposition is deferred at this time, awaiting improvement of SOB.  Anticipated discharge in approximately 1-2day(s).  The patient does have a current PCP ([facility]) (No primary provider on file.) and is not requiring OPC follow-up after discharge.  The patient does not have transportation limitations that hinder transportation to clinic appointments.  .Services Needed at time of discharge: Y = Yes, Blank = No PT:   OT:   RN:   Equipment:   Other:     LOS: 1 day   Darden Palmer 08/30/2012, 9:56 AM

## 2012-08-31 DIAGNOSIS — I2699 Other pulmonary embolism without acute cor pulmonale: Secondary | ICD-10-CM | POA: Diagnosis present

## 2012-08-31 LAB — PROTIME-INR
INR: 1.28 (ref 0.00–1.49)
Prothrombin Time: 15.7 seconds — ABNORMAL HIGH (ref 11.6–15.2)

## 2012-08-31 LAB — CBC
Hemoglobin: 9 g/dL — ABNORMAL LOW (ref 12.0–15.0)
MCH: 21.4 pg — ABNORMAL LOW (ref 26.0–34.0)
MCHC: 28.9 g/dL — ABNORMAL LOW (ref 30.0–36.0)
MCV: 73.9 fL — ABNORMAL LOW (ref 78.0–100.0)
Platelets: 270 10*3/uL (ref 150–400)
RDW: 25.2 % — ABNORMAL HIGH (ref 11.5–15.5)

## 2012-08-31 LAB — BASIC METABOLIC PANEL
Calcium: 8.7 mg/dL (ref 8.4–10.5)
Chloride: 105 mEq/L (ref 96–112)
Creatinine, Ser: 0.79 mg/dL (ref 0.50–1.10)
GFR calc Af Amer: 84 mL/min — ABNORMAL LOW (ref >90)
Sodium: 139 mEq/L (ref 135–145)

## 2012-08-31 LAB — HEPARIN LEVEL (UNFRACTIONATED): Heparin Unfractionated: 0.55 IU/mL (ref 0.30–0.70)

## 2012-08-31 MED ORDER — WARFARIN SODIUM 5 MG PO TABS
5.0000 mg | ORAL_TABLET | Freq: Once | ORAL | Status: AC
  Administered 2012-08-31: 5 mg via ORAL
  Filled 2012-08-31: qty 1

## 2012-08-31 NOTE — Evaluation (Signed)
Occupational Therapy Evaluation Patient Details Name: Destiny Williams MRN: [id number] DOB: 1922/01/22 Today's Date: 08/31/2012 Time: 4132-4401 OT Time Calculation (min): 23 min  OT Assessment / Plan / Recommendation Clinical Impression  Pt admitted with multiple bil. pulmonary emboli. Will benefit from acute OT services to address below problem list.  Recommending pt return to daughter's home initally with 24/7 assist/supervision.    OT Assessment  Patient needs continued OT Services    Follow Up Recommendations  No OT follow up;Supervision/Assistance - 24 hour    Barriers to Discharge      Equipment Recommendations       Recommendations for Other Services    Frequency  Min 2X/week    Precautions / Restrictions Precautions Precautions: Fall Restrictions Weight Bearing Restrictions: No   Pertinent Vitals/Pain O2 sat dropped to 87% with ambulation on room air. Returned to 96% within 30 seconds of reapplying O2 at 2 l/min     ADL  Upper Body Dressing: Performed;Set up Where Assessed - Upper Body Dressing: Unsupported sitting Toilet Transfer: Simulated;Minimal assistance Toilet Transfer Method: Sit to stand Toilet Transfer Equipment: Other (comment) (bed) Toileting - Clothing Manipulation and Hygiene: Performed;Min guard Where Assessed - Toileting Clothing Manipulation and Hygiene: Standing Equipment Used: Gait belt;Other (comment) (rollator) Transfers/Ambulation Related to ADLs: min assist with rollator.  Pt ambulated with increased speed which is unsafe. Does not slow down despite multiple verbal cues. ADL Comments: Min guard while pt adjusted undergarment up over hips in standing position.  Reports she had given herself a full sponge bath earlier in day without nursing assist and stood at sink to perform grooming prior to OT arrival.    OT Diagnosis: Generalized weakness  OT Problem List: Decreased strength;Decreased activity tolerance;Decreased safety awareness;Decreased  knowledge of use of DME or AE;Cardiopulmonary status limiting activity OT Treatment Interventions: Self-care/ADL training;DME and/or AE instruction;Therapeutic activities;Patient/family education;Energy conservation   OT Goals Acute Rehab OT Goals OT Goal Formulation: With patient Time For Goal Achievement: 09/07/12 Potential to Achieve Goals: Good ADL Goals Pt Will Perform Grooming: with modified independence;Standing at sink ADL Goal: Grooming - Progress: Goal set today Pt Will Perform Upper Body Dressing: with modified independence;Sitting, chair;Sitting, bed (independently initiating rest breaks as needed) ADL Goal: Upper Body Dressing - Progress: Goal set today Pt Will Perform Lower Body Dressing: with modified independence;Sit to stand from chair;Sit to stand from bed (independently initiating rest breaks as needed) ADL Goal: Lower Body Dressing - Progress: Goal set today Pt Will Transfer to Toilet: with modified independence;Ambulation;with DME;Comfort height toilet ADL Goal: Toilet Transfer - Progress: Goal set today Pt Will Perform Toileting - Clothing Manipulation: with modified independence;Standing ADL Goal: Toileting - Clothing Manipulation - Progress: Goal set today Pt Will Perform Toileting - Hygiene: with modified independence;Sit to stand from 3-in-1/toilet ADL Goal: Toileting - Hygiene - Progress: Goal set today Pt Will Perform Tub/Shower Transfer: Tub transfer;with supervision;Ambulation;with DME;Shower seat with back ADL Goal: Web designer - Progress: Goal set today Miscellaneous OT Goals Miscellaneous OT Goal #1: Pt will perform functional mobility at mod I level while consistently demosntrating safe use of DME at all times. OT Goal: Miscellaneous Goal #1 - Progress: Goal set today  Visit Information  Last OT Received On: 08/31/12 Assistance Needed: +1 PT/OT Co-Evaluation/Treatment: Yes    Subjective Data      Prior Functioning     Home Living Lives  With: Alone ("[facility]) Available Help at Discharge: Family;Available 24 hours/day (going to daughter's home at discharge) Type  of Home: House Home Access: Stairs to enter [employer] of Steps: 2 Entrance Stairs-Rails: None Home Layout: Two level;Able to live on main level with bedroom/bathroom Bathroom Shower/Tub: Engineer, manufacturing systems: Standard Home Adaptive Equipment: Dan Humphreys - four wheeled;Straight cane;Shower chair with back (3-wheeled walker) Additional Comments: Information above is related to daughter's home. Prior Function Level of Independence: Independent with assistive device(s);Needs assistance Needs Assistance: Meal Prep;Light Housekeeping Meal Prep: Maximal Light Housekeeping: Maximal Able to Take Stairs?: No Driving: Yes Vocation: Retired Comments: Pt in [place] visiting daughter over holidays.  Pt lives in [place] in Independent living but plans to d/c to live with daughter indefinitely. Communication Communication: No difficulties         Vision/Perception     Cognition  Overall Cognitive Status: Impaired Area of Impairment: Safety/judgement;Awareness of deficits;Problem solving Arousal/Alertness: Awake/alert Orientation Level: Appears intact for tasks assessed Behavior During Session: [facility] for tasks performed Safety/Judgement: Decreased safety judgement for tasks assessed;Impulsive;Decreased awareness of need for assistance Safety/Judgement - Other Comments: Gait speed at end of session unsafe - moving very quickly.  Doesn't use brakes on rollator.  Cues to call for assist before trying to get OOB. Problem Solving: Leaves rollator at foot of bed and attempts to walk and turn around to get to bed without assistive device, rather than keeping rollator with her until she is ready to sit.    Extremity/Trunk Assessment Right Upper Extremity Assessment RUE ROM/Strength/Tone: WFL for tasks assessed Left Upper Extremity  Assessment LUE ROM/Strength/Tone: WFL for tasks assessed Right Lower Extremity Assessment RLE ROM/Strength/Tone: Deficits RLE ROM/Strength/Tone Deficits: General weakness - strength 4/5 RLE Sensation: WFL - Light Touch Left Lower Extremity Assessment LLE ROM/Strength/Tone: Deficits LLE ROM/Strength/Tone Deficits: General weakness - strength 4/5 LLE Sensation: WFL - Light Touch Trunk Assessment Trunk Assessment: Kyphotic     Mobility Bed Mobility Bed Mobility: Supine to Sit;Sit to Supine Supine to Sit: 4: Min guard;With rails Sit to Supine: 4: Min guard;HOB elevated Details for Bed Mobility Assistance: Verbal cues to move at safely. Transfers Transfers: Sit to Stand;Stand to Sit Sit to Stand: 4: Min assist;With upper extremity assist;From bed Stand to Sit: 4: Min assist;With upper extremity assist;To bed Details for Transfer Assistance: Verbal cues for hand placement and safety with transfers.  Patient unsafe with transition back to sitting on EOB.  Left RW at foot of bed rather than keeping it with her until ready to sit.     Shoulder Instructions     Exercise     Balance     End of Session OT - End of Session Equipment Utilized During Treatment: Gait belt Activity Tolerance: Patient tolerated treatment well Patient left: in bed;with call bell/phone within reach;with bed alarm set Nurse Communication: Mobility status  GO    08/31/2012 Cipriano Mile OTR/L Pager [phone] Office [phone]  Cipriano Mile 08/31/2012, 3:22 PM

## 2012-08-31 NOTE — Progress Notes (Signed)
ANTICOAGULATION CONSULT NOTE - Initial Consult  Pharmacy Consult for Heparin/Coumadin Indication: R/O pulmonary embolus  Allergies  Allergen Reactions  . Codeine Nausea And Vomiting    Patient Measurements: Height: 5\' 4"  (162.6 cm) Weight: 151 lb 0.2 oz (68.5 kg) IBW/kg (Calculated) : 54.7  Heparin Dosing Weight: 68 kg  Vital Signs: Temp: 98.3 F (36.8 C) (12/28 0504) Temp src: Oral (12/28 0504) BP: 148/85 mmHg (12/28 0504) Pulse Rate: 96  (12/28 0504)  Labs:  Alvira Philips 08/31/12 0758 08/31/12 0525 08/30/12 2237 08/30/12 1051 08/30/12 0435 08/29/12 1931 08/29/12 1523 08/29/12 1516  HGB -- 9.0* -- 9.2* -- -- -- --  HCT -- 31.1* -- 31.1* 29.6* -- -- --  PLT -- 270 -- 273 260 -- -- --  APTT -- -- -- -- -- -- 32 --  LABPROT -- 15.7* -- -- -- -- 15.6* --  INR -- 1.28 -- -- -- -- 1.27 --  HEPARINUNFRC 0.55 -- 0.39 -- -- -- -- --  CREATININE -- 0.79 -- -- 0.89 -- -- 1.12*  CKTOTAL -- -- -- -- -- -- -- --  CKMB -- -- -- -- -- -- -- --  TROPONINI -- -- -- -- -- <0.30 -- --    Estimated Creatinine Clearance: 46.2 ml/min (by C-G formula based on Cr of 0.79).   Medical History: Past Medical History  Diagnosis Date  . HTN (hypertension)   . Hypothyroidism   . Hypercholesteremia   . Depression   . Anxiety   . Shortness of breath   . Anemia      Assessment: 89 YOF on D#2 heparin/coumadin for new PE. Heparin level therapeutic at 1150 units/hr, INR 1.28, Hgb and plt are stable, no bleeding reported.  Goal of Therapy:  Heparin level 0.3-0.7 units/ml Monitor platelets by anticoagulation protocol: Yes   Plan:  - Continue heparin infusion at current rate - Coumadin 5mg  po x 1  - Daily PT/INR, cbc and heparin level with AM labs   Bayard Hugger, PharmD, BCPS  Clinical Pharmacist  Pager: [phone]

## 2012-08-31 NOTE — Progress Notes (Signed)
Internal Medicine Teaching Service Attending Note Date: 08/31/2012  Patient name: Kanitra Purifoy  Medical record number: [id number]  Date of birth: 11-Aug-1922    This patient has been seen and discussed with the house staff. Please see their note for complete details. I concur with their findings with the following additions/corrections: I saw Ms Davidson and discussed her care with Dr Heloise Beecham. Her daughter was present. She feels that her breathing is bit better today and she isn't struggling to breathe. She still feels a bit jittery.   1. Acute PE - this caused acute resp failure but she is now stabilizing. On Heparin gtt and coumadin. Can transitioned to lovenox if needed for dispo. Rivaroxaban could be considered instead of warfarin but was studied only in an open label study. Would consider it if coumadin dosing and monitoring becomes an issue.   She is not yet stable for D/C as still in mild resp distress. Will need to get PT / OT consult and monitor O2 with ambulation. I encouraged pt to move as tolerated so that she doesn't get deconditioned.   Her ECHO does show RV strain as a result of her multiple subsegmental PE's. This would have caused the increase in pro BNP.  2. Grade 2 Diastolic dysnfxn - reviewed results with pt and daughter. Stressed only need [place] to control BP. Never had CHF in past.  3. Anemia - seems to be all iron def. On FESO4. Discuss outpt colonoscopy with pt. No signs of hemolysis  4. Dispo - Likely requires few more days. PT/OT eval   Tasheema Perrone 08/31/2012, 8:43 AM

## 2012-08-31 NOTE — Progress Notes (Signed)
ANTICOAGULATION CONSULT NOTE - Follow Up Consult  Pharmacy Consult for Heparin / Coumadin Indication: pulmonary embolus  Allergies  Allergen Reactions  . Codeine Nausea And Vomiting    Patient Measurements: Height: 5\' 4"  (162.6 cm) Weight: 151 lb 9.6 oz (68.765 kg) IBW/kg (Calculated) : 54.7  Heparin Dosing Weight: 68 kg  Vital Signs: Temp: 99.2 F (37.3 C) (12/27 2121) Temp src: Oral (12/27 2121) BP: 134/81 mmHg (12/27 2121) Pulse Rate: 99  (12/27 2121)  Labs:  Basename 08/30/12 2237 08/30/12 1051 08/30/12 0435 08/29/12 1931 08/29/12 1523 08/29/12 1516  HGB -- 9.2* 8.8* -- -- --  HCT -- 31.1* 29.6* -- -- 29.2*  PLT -- 273 260 -- -- 355  APTT -- -- -- -- 32 --  LABPROT -- -- -- -- 15.6* --  INR -- -- -- -- 1.27 --  HEPARINUNFRC 0.39 -- -- -- -- --  CREATININE -- -- 0.89 -- -- 1.12*  CKTOTAL -- -- -- -- -- --  CKMB -- -- -- -- -- --  TROPONINI -- -- -- <0.30 -- --    Estimated Creatinine Clearance: 41.6 ml/min (by C-G formula based on Cr of 0.89).   Medical History: Past Medical History  Diagnosis Date  . HTN (hypertension)   . Hypothyroidism   . Hypercholesteremia   . Depression   . Anxiety   . Shortness of breath   . Anemia      Assessment: 67 YOF admitted with bilateral pulmonary emboli. Pharmacy to manage Coumadin / heparin overlap (minimum of 5 days starting 12/27, with 2 days INR at-goal). Heparin level (0.39) is at lower-end of goal range on 1100 units/hr. No problem with line / infusion and no bleeding per RN.   Goal of Therapy:  Heparin level 0.3-0.7 units/ml INR 2-3 Monitor platelets by anticoagulation protocol: Yes   Plan:  1. Increase IV heparin to 1150 units/hr to keep at-goal.  2. Heparin level in 8 hours.   Lorre Munroe, PharmD 08/31/2012,12:00 AM

## 2012-08-31 NOTE — ED Provider Notes (Signed)
I saw and evaluated the patient, reviewed the resident's note and I agree with the findings and plan.  Derwood Kaplan, MD 08/31/12 [phone]

## 2012-08-31 NOTE — Evaluation (Addendum)
Physical Therapy Evaluation Patient Details Name: Destiny Williams MRN: [id number] DOB: Jun 04, 1922 Today's Date: 08/31/2012 Time: 4098-1191 PT Time Calculation (min): 23 min  PT Assessment / Plan / Recommendation Clinical Impression  Patient is an 78 yo female admitted with multiple bil. pulmonary emboli.  Heparin in therapeutic range per pharmacy.  Patient with general weakness, DOE, and decreased safety awareness impacting functional mobility.  Patient will benefit from acute PT to maximize independence/safety prior to discharge home with daughter.    PT Assessment  Patient needs continued PT services    Follow Up Recommendations  Home health PT;Supervision/Assistance - 24 hour    Does the patient have the potential to tolerate intense rehabilitation      Barriers to Discharge None      Equipment Recommendations  None recommended by PT    Recommendations for Other Services     Frequency Min 3X/week    Precautions / Restrictions Precautions Precautions: Fall Restrictions Weight Bearing Restrictions: No   Pertinent Vitals/Pain O2 sat dropped to 87% with ambulation on room air.  Returned to 96% within 30 seconds of reapplying O2 at 2 l/min.       Mobility  Bed Mobility Bed Mobility: Supine to Sit;Sit to Supine Supine to Sit: 4: Min guard;With rails Sit to Supine: 4: Min guard;HOB elevated Details for Bed Mobility Assistance: Verbal cues to move at safely. Transfers Transfers: Sit to Stand;Stand to Sit Sit to Stand: 4: Min assist;With upper extremity assist;From bed Stand to Sit: 4: Min assist;With upper extremity assist;To bed Details for Transfer Assistance: Verbal cues for hand placement and safety with transfers.  Patient unsafe with transition back to sitting on EOB.  Left RW at foot of bed rather than keeping it with her until ready to sit. Ambulation/Gait Ambulation/Gait Assistance: 4: Min assist Ambulation Distance (Feet): 62 Feet Assistive device: 4-wheeled  walker Ambulation/Gait Assistance Details: Verbal cues to keep rollator close to body and move at slow safe speed.  Patient with decreased control of rollator and balance during turn, requiring assist for safety.  Increased gait speed at end of walk to unsafe level. Gait Pattern: Step-through pattern;Trunk flexed Gait velocity: Cues to slow to safe speed. Stairs: No           PT Diagnosis: Difficulty walking;Abnormality of gait;Generalized weakness;Altered mental status  PT Problem List: Decreased strength;Decreased activity tolerance;Decreased balance;Decreased mobility;Decreased cognition;Decreased knowledge of use of DME;Decreased safety awareness;Cardiopulmonary status limiting activity PT Treatment Interventions: DME instruction;Gait training;Functional mobility training;Cognitive remediation;Patient/family education   PT Goals Acute Rehab PT Goals PT Goal Formulation: With patient Time For Goal Achievement: 09/07/12 Potential to Achieve Goals: Good Pt will go Supine/Side to Sit: Independently;with HOB 0 degrees PT Goal: Supine/Side to Sit - Progress: Goal set today Pt will go Sit to Stand: with supervision;with upper extremity assist PT Goal: Sit to Stand - Progress: Goal set today Pt will go Stand to Sit: with supervision;with upper extremity assist PT Goal: Stand to Sit - Progress: Goal set today Pt will Ambulate: 51 - 150 feet;with supervision;with rolling walker (safely without loss of balance) PT Goal: Ambulate - Progress: Goal set today  Visit Information  Last PT Received On: 08/31/12 Assistance Needed: +1 PT/OT Co-Evaluation/Treatment: Yes    Subjective Data  Subjective: "I'm going to my daughter's for a while." Patient Stated Goal: To find my own place here in [place].   Prior Functioning  Home Living Lives With: Alone ("[facility] in [facility]) Available Help at Discharge: Family;Available 24 hours/day (going  to daughter's home at discharge) Type of Home:  House Home Access: Stairs to enter [employer] of Steps: 2 Entrance Stairs-Rails: None Home Layout: Two level;Able to live on main level with bedroom/bathroom Bathroom Shower/Tub: Engineer, manufacturing systems: Standard Home Adaptive Equipment: Dan Humphreys - four wheeled;Straight cane;Shower chair with back (3-wheeled walker) Additional Comments: Information above is related to daughter's home. Prior Function Level of Independence: Independent with assistive device(s);Needs assistance Needs Assistance: Meal Prep;Light Housekeeping Meal Prep: Maximal Light Housekeeping: Maximal Able to Take Stairs?: No Driving: Yes Vocation: Retired Musician: No difficulties    Cognition  Overall Cognitive Status: Impaired Area of Impairment: Safety/judgement;Awareness of deficits;Problem solving Arousal/Alertness: Awake/alert Orientation Level: Appears intact for tasks assessed Behavior During Session: [facility] for tasks performed Safety/Judgement: Decreased safety judgement for tasks assessed;Impulsive;Decreased awareness of need for assistance Safety/Judgement - Other Comments: Gait speed at end of session unsafe - moving very quickly.  Doesn't use brakes on rollator.  Cues to call for assist before trying to get OOB. Problem Solving: Leaves rollator at foot of bed and attempts to walk and turn around to get to bed without assistive device, rather than keeping rollator with her until she is ready to sit.    Extremity/Trunk Assessment Right Upper Extremity Assessment RUE ROM/Strength/Tone: WFL for tasks assessed Left Upper Extremity Assessment LUE ROM/Strength/Tone: WFL for tasks assessed Right Lower Extremity Assessment RLE ROM/Strength/Tone: Deficits RLE ROM/Strength/Tone Deficits: General weakness - strength 4/5 RLE Sensation: WFL - Light Touch Left Lower Extremity Assessment LLE ROM/Strength/Tone: Deficits LLE ROM/Strength/Tone Deficits: General weakness - strength  4/5 LLE Sensation: WFL - Light Touch Trunk Assessment Trunk Assessment: Kyphotic   Balance    End of Session PT - End of Session Equipment Utilized During Treatment: Gait belt Activity Tolerance: Treatment limited secondary to medical complications (Comment) (Decreased O2 sat to 87% with gait on room air.) Patient left: in bed;with call bell/phone within reach Nurse Communication: Mobility status  GP     Vena Austria 08/31/2012, 2:58 PM Durenda Hurt. Renaldo Fiddler, [facility] Acute Rehab Services Pager [phone]

## 2012-08-31 NOTE — Progress Notes (Signed)
Subjective: Destiny Williams and daughter were in the room when I examined her. Patient reports that her SOB has improved, and that she was able to get to the toilet without any dyspnea this morning. She is breathing comfortably at rest. She is concerned about getting weak with lying in bed.  Denies CP, N/V, dizziness, abdominal pain, diarrhea.  Objective: Vital signs in last 24 hours: Filed Vitals:   08/30/12 1124 08/30/12 1420 08/30/12 2121 08/31/12 0504  BP: 129/71 124/68 134/81 148/85  Pulse: 121 100 99 96  Temp: 98.3 F (36.8 C) 98.5 F (36.9 C) 99.2 F (37.3 C) 98.3 F (36.8 C)  TempSrc: Oral Oral Oral Oral  Resp: 18 18 18 18   Height:      Weight:    151 lb 0.2 oz (68.5 kg)  SpO2: 86% 90% 97% 98%   Weight change: -2 lb 4.6 oz (-1.036 kg)  Intake/Output Summary (Last 24 hours) at 08/31/12 1610 Last data filed at 08/31/12 0711  Gross per 24 hour  Intake 622.63 ml  Output    552 ml  Net  70.63 ml   Vitals reviewed. General: resting in bed, NAD HEENT: PERRLA, EOMI, no scleral icterus Cardiac: RRR, no rubs, murmurs or gallops Pulm: Satting 98-100% on 2L [place]. I removed [place] in bed and she maintained saturations of 93-96%. Instructed her to keep O2 off unless ambulating or feels SOB. Lungs with fine bilateral crackles, no wheezes or rhonchi.  Abd: soft, nontender, nondistended, BS present Ext: warm and well perfused, no pedal edema, DP pulses 2+ bilaterally  Neuro: alert and oriented X3, cranial nerves II-XII grossly intact, strength and sensation to light touch equal in bilateral upper and lower extremities  Lab Results: Basic Metabolic Panel:  Lab 08/31/12 9604 08/30/12 0435  NA 139 139  K 4.1 3.4*  CL 105 104  CO2 25 25  GLUCOSE 119* 114*  BUN 12 17  CREATININE 0.79 0.89  CALCIUM 8.7 8.8  MG -- --  PHOS -- --   Liver Function Tests:  Lab 08/29/12 1516  AST 20  ALT 35  ALKPHOS 87  BILITOT 0.5  PROT 6.6  ALBUMIN 3.6   CBC:  Lab 08/31/12 0525 08/30/12 1051  08/29/12 1516  WBC 13.0* 14.4* --  NEUTROABS -- -- 11.0*  HGB 9.0* 9.2* --  HCT 31.1* 31.1* --  MCV 73.9* 72.7* --  PLT 270 273 --   Cardiac Enzymes:  Lab 08/29/12 1931  CKTOTAL --  CKMB --  CKMBINDEX --  TROPONINI <0.30   BNP:  Lab 08/29/12 1931  PROBNP 2982.0*   Thyroid Function Tests:  Lab 08/29/12 2127  TSH 2.017  T4TOTAL --  FREET4 --  T3FREE --  THYROIDAB --   Coagulation:  Lab 08/31/12 0525 08/29/12 1523  LABPROT 15.7* 15.6*  INR 1.28 1.27   Anemia Panel:  Lab 08/29/12 2127  VITAMINB12 640  FOLATE >20.0  FERRITIN 17  TIBC 389  IRON 10*  RETICCTPCT 4.1*   Urinalysis:  Lab 08/29/12 2312  COLORURINE YELLOW  LABSPEC 1.013  PHURINE 6.0  GLUCOSEU NEGATIVE  HGBUR NEGATIVE  BILIRUBINUR NEGATIVE  KETONESUR NEGATIVE  PROTEINUR 30*  UROBILINOGEN 0.2  NITRITE NEGATIVE  LEUKOCYTESUR NEGATIVE   Micro Results: Recent Results (from the past 240 hour(s))  MRSA PCR SCREENING     Status: Normal   Collection Time   08/29/12 11:02 PM      Component Value Range Status Comment   MRSA by PCR NEGATIVE  NEGATIVE Final  Studies/Results: Dg Chest 2 View  08/29/2012  *RADIOLOGY REPORT*  Clinical Data: 78 year old female with weakness and shortness of breath.  CHEST - 2 VIEW  Comparison: None  Findings: Cardiomegaly is identified. A moderate to large hiatal hernia is present. Left basilar atelectasis/scarring is noted.  There is no evidence of focal airspace disease, pulmonary edema, suspicious pulmonary nodule/mass, pleural effusion, or pneumothorax. No acute bony abnormalities are identified.  IMPRESSION: Cardiomegaly with left basilar atelectasis/scarring.  Moderate to large hiatal hernia.   Original Report Authenticated By: Harmon Pier, M.D.    Dg Shoulder Right  08/29/2012  *RADIOLOGY REPORT*  Clinical Data: Right shoulder pain and bruising; question of fall.  RIGHT SHOULDER - 2+ VIEW  Comparison: None.  Findings: There is no evidence of fracture or  dislocation.  The right humeral head is seated within the glenoid fossa, though there is superior subluxation of the humeral head, with associated mild cortical irregularity and mild bony remodelling of the right acromion.  Osteophytes are seen arising from the medial humeral head.  The acromioclavicular joint is unremarkable in appearance.  No significant soft tissue abnormalities are seen.  The visualized portions of the right lung are clear.  IMPRESSION:  1.  No evidence of fracture or dislocation. 2.  Chronic degenerative changes noted at the right glenohumeral joint, with superior subluxation of the right humeral head, reflecting remote rotator cuff injury.   Original Report Authenticated By: Tonia Ghent, M.D.    Ct Angio Chest Pe W/cm &/or Wo Cm  08/30/2012  *RADIOLOGY REPORT*  Clinical Data: Shortness of breath.  CT ANGIOGRAPHY CHEST  Technique:  Multidetector CT imaging of the chest using the standard protocol during bolus administration of intravenous contrast. Multiplanar reconstructed images including MIPs were obtained and reviewed to evaluate the vascular anatomy.  Contrast: OMNIPAQUE IOHEXOL 350 MG/ML SOLN 100 ml Omnipaque- 300 IV  Comparison: None.  Findings: There is good contrast opacification of the pulmonary arteries.  Multiple segmental filling defects involving right middle lobe, posterior basal segment right lower lobe, posterior, medial, and lateral basal segments left lower lobe, lingula.  Small bilateral pleural effusions.  There is early contrast opacification of the thoracic aorta without suggestion of dissection, aneurysm, or stenosis.  Patchy coarse coronary and aortic calcifications. Tiny pericardial effusion.  Sub centimeter prevascular, AP window, precarinal, and subcarinal lymph nodes.  No hilar adenopathy.  Mild dependent atelectasis posteriorly in both lower lobes.  There is moderate hiatal hernia.  Remainder of the visualized upper abdomen unremarkable.  Spondylitic  changes in the mid thoracic spine.  IMPRESSION: 1.  Multiple bilateral segmental pulmonary emboli. 2. Atherosclerosis, including . coronary artery disease. Please note that although the presence of coronary artery calcium documents the presence of coronary artery disease, the severity of this disease and any potential stenosis cannot be assessed on this non-gated CT examination.  Assessment for potential risk factor modification, dietary therapy or pharmacologic therapy may be warranted, if clinically indicated. 3.  Moderate hiatal hernia.   Original Report Authenticated By: D. Andria Rhein, MD    Medications: I have reviewed the patient's current medications. Scheduled Meds:    . ferrous sulfate  325 mg Oral BID WC  . irbesartan  150 mg Oral Daily  . levothyroxine  62.5 mcg Oral QAC breakfast  . PARoxetine  10 mg Oral Daily  . sodium chloride  3 mL Intravenous Q12H  . Warfarin - Pharmacist Dosing Inpatient   Does not apply q1800   Continuous Infusions:    .  heparin 1,150 Units/hr (08/31/12 0711)   PRN Meds:.acetaminophen, acetaminophen, celecoxib, ondansetron (ZOFRAN) IV, ondansetron, zolpidem Assessment/Plan: Ms. Torti is a 78 yo female with a PMHx of iron-deficiency anemia, HTN, HLD, hypothyroidism admitted for worsening SOB in setting of acute pulmonary embolism.    1) Acute PE CTA demonstrated multiple bilateral segmental pulmonary emboli. Patient placed on heparin drip and started warfarin therapy. Respiratory distress has improved. Some evidence of RV strain on echo, but no overt clinical signs/symptoms of heart failure. Now maintaining oxygen saturations 93-96% on room air.  - Continue heparin and warfarin therapy. May be changed to lovenox if needed to continue bridging on discharge. Daughter will be able to provide lovenox injections at home.  - PT/OT evaluation today - Pt wishes to follow up with [facility] and Dr. Alexandria Lodge on discharge.   2) Anemia  Labs consistent with Fe deficient  anemia. FOBT negative on admission, no active bleeding other than some w hemorrhoids. Was transfused 1U pRBCs on 08/29/12. Fe therapy started today. Hb remains stable at 9.0 today.  - Continue iron 325mg  BID - Daily CBCs - Outpatient colonoscopy  3) Grade 2 diastolic dysfunc No history of CHF, no evidence of overt heart failure.  - Blood pressure control with home ARB  4) HTN Continue irbesartan 150mg  daily  5) HLD    states she was recommended to start a statin by her PCP in the past, but declined this intervention. -continue to monitor   6) Hypothyroidism  On synthroid.  - TSH 2.017  DVT PPX - heparin CODE STATUS - DNR  CONSULTS PLACED - N/A  DISPO - Disposition is deferred at this time, awaiting symptomatic improvement.  Anticipated discharge in approximately 1-2day(s).  The patient will require OPC and coag clinic  follow-up after discharge.  The patient does not have transportation limitations that hinder transportation to clinic appointments.  .Services Needed at time of discharge: Y = Yes, Blank = No PT:   OT:   RN:   Equipment:   Other:     LOS: 2 days   Bronson Curb 08/31/2012, 9:22 AM

## 2012-09-01 ENCOUNTER — Other Ambulatory Visit: Payer: Self-pay | Admitting: Internal Medicine

## 2012-09-01 LAB — CBC
HCT: 31 % — ABNORMAL LOW (ref 36.0–46.0)
MCH: 21.8 pg — ABNORMAL LOW (ref 26.0–34.0)
MCHC: 29.4 g/dL — ABNORMAL LOW (ref 30.0–36.0)
MCV: 74.2 fL — ABNORMAL LOW (ref 78.0–100.0)
Platelets: 261 10*3/uL (ref 150–400)
RBC: 4.18 MIL/uL (ref 3.87–5.11)
RDW: 26 % — ABNORMAL HIGH (ref 11.5–15.5)

## 2012-09-01 LAB — BASIC METABOLIC PANEL
BUN: 12 mg/dL (ref 6–23)
Calcium: 8.7 mg/dL (ref 8.4–10.5)
Chloride: 105 mEq/L (ref 96–112)
Creatinine, Ser: 0.77 mg/dL (ref 0.50–1.10)
GFR calc Af Amer: 84 mL/min — ABNORMAL LOW (ref >90)
GFR calc non Af Amer: 73 mL/min — ABNORMAL LOW (ref >90)

## 2012-09-01 LAB — PROTIME-INR: Prothrombin Time: 15.8 seconds — ABNORMAL HIGH (ref 11.6–15.2)

## 2012-09-01 MED ORDER — FERROUS SULFATE 325 (65 FE) MG PO TABS: 325.0000 mg | ORAL_TABLET | Freq: Two times a day (BID) | ORAL | Status: AC

## 2012-09-01 MED ORDER — WARFARIN SODIUM 5 MG PO TABS: 5.0000 mg | ORAL_TABLET | Freq: Every day | ORAL | Status: DC

## 2012-09-01 MED ORDER — ENOXAPARIN (LOVENOX) PATIENT EDUCATION KIT
PACK | Freq: Once | Status: DC
  Filled 2012-09-01: qty 1

## 2012-09-01 MED ORDER — WARFARIN SODIUM 7.5 MG PO TABS
7.5000 mg | ORAL_TABLET | Freq: Once | ORAL | Status: DC
  Filled 2012-09-01: qty 1

## 2012-09-01 MED ORDER — ENOXAPARIN SODIUM 100 MG/ML ~~LOC~~ SOLN: 1.0000 mg/kg | Freq: Two times a day (BID) | SUBCUTANEOUS | Status: AC

## 2012-09-01 MED ORDER — ENOXAPARIN SODIUM 80 MG/0.8ML ~~LOC~~ SOLN
1.0000 mg/kg | Freq: Two times a day (BID) | SUBCUTANEOUS | Status: DC
  Filled 2012-09-01 (×2): qty 0.8

## 2012-09-01 NOTE — Progress Notes (Signed)
ANTICOAGULATION CONSULT NOTE - Initial Consult  Pharmacy Consult for Heparin/Coumadin to change to lovenox/coumadin Indication: R/O pulmonary embolus  Allergies  Allergen Reactions  . Codeine Nausea And Vomiting    Patient Measurements: Height: 5\' 4"  (162.6 cm) Weight: 150 lb 5.7 oz (68.2 kg) IBW/kg (Calculated) : 54.7  Heparin Dosing Weight: 68 kg  Vital Signs: Temp: 98.1 F (36.7 C) (12/29 0434) Temp src: Oral (12/29 0434) BP: 121/69 mmHg (12/29 0434) Pulse Rate: 82  (12/29 0434)  Labs:  Alvira Philips 09/01/12 0625 08/31/12 0758 08/31/12 0525 08/30/12 2237 08/30/12 1051 08/30/12 0435 08/29/12 1931 08/29/12 1523  HGB 9.1* -- 9.0* -- -- -- -- --  HCT 31.0* -- 31.1* -- 31.1* -- -- --  PLT 261 -- 270 -- 273 -- -- --  APTT -- -- -- -- -- -- -- 32  LABPROT 15.8* -- 15.7* -- -- -- -- 15.6*  INR 1.29 -- 1.28 -- -- -- -- 1.27  HEPARINUNFRC 0.38 0.55 -- 0.39 -- -- -- --  CREATININE 0.77 -- 0.79 -- -- 0.89 -- --  CKTOTAL -- -- -- -- -- -- -- --  CKMB -- -- -- -- -- -- -- --  TROPONINI -- -- -- -- -- -- <0.30 --    Estimated Creatinine Clearance: 46.1 ml/min (by C-G formula based on Cr of 0.77).   Medical History: Past Medical History  Diagnosis Date  . HTN (hypertension)   . Hypothyroidism   . Hypercholesteremia   . Depression   . Anxiety   . Shortness of breath   . Anemia     Assessment: 89 YOF on D#3 heparin/coumadin for new PE. Heparin level therapeutic. INR 1.29. MD would like to change heparin to lovenox BID in preparation for patient discharge. Hgb and plt are stable, no bleeding reported. Patient has an estimated creatinine clearance of 44ml/min so BID dosing is applicable.   Goal of Therapy:  Monitor platelets by anticoagulation protocol: Yes   Plan:  - Discontinue heparin - Begin lovenox 1mg /kg (70mg ) sq BID at 1000 am today  - Coumadin 7.5mg  x 1 dose today and could d/c on 5mg  daily with f/u MD appointment (unknown INR trend with 2 days of coumadin) - Daily  PT/INR, cbc  Thank you,  Brett Fairy, PharmD, BCPS 09/01/2012 9:11 AM

## 2012-09-01 NOTE — Progress Notes (Signed)
09/01/2012 1000 NCM spoke with daughter, Anaiah Mcmannis. NCM offered choice and agreeable to [facility] for [facility]. HH faxed orders, dc summary, and facesheet to [employer]. Isidoro Donning RN CCM Case Mgmt phone [phone]

## 2012-09-01 NOTE — Progress Notes (Signed)
Internal Medicine Teaching Service Attending Note Date: 09/01/2012  Patient name: Jenavee Laguardia  Medical record number: [id number]  Date of birth: 1921/12/22    This patient has been seen and discussed with the house staff. Please see their note for complete details. I concur with their findings with the following additions/corrections: Ms Renwick cont to feel better. Now off O2 and was able to ambulate off O2 and felt better after walking! She did transiently drop her O2 sat but it quickly rebounded up off O2. She is no longer feeling shaking or nervous or SOB. Ready for D/C home on Lovenox and warfarin. INR - maybe by home health and then Dr Alexandria Lodge once holidays over. Requested F/U Dr Josem Kaufmann since he has pul background. Will be moving to [place] and living with her daughter for a few months while they look for assisted living or independent living.   BUTCHER,ELIZABETH 09/01/2012, 11:39 AM

## 2012-09-01 NOTE — Progress Notes (Signed)
O2 sat on room air 93%. Pt ambulated in hallway to nurses station on room air. Sat on return was 88-89%, hr slightly elevated to 110's. Pt recovered quickly to 94% while up in chair with hr going back to baseline in 90's

## 2012-09-01 NOTE — Discharge Summary (Signed)
Internal Medicine Teaching [facility] Discharge Note  Name: Destiny Williams MRN: [id number] DOB: 1921-12-08 78 y.o.  Date of Admission: 08/29/2012  7:01 PM Date of Discharge: 09/01/2012 Attending Physician: Burns Spain, MD  Discharge Diagnosis: Acute pulmonary emboli Iron deficiency anemia Grade 2 diastolic dysfunction Hypertension Hypothyroidism Degenerative joint disease - Right shoulder   Discharge Medications:   Medication List     As of 09/01/2012 10:24 AM    STOP taking these medications         diphenhydrAMINE 25 MG tablet   Commonly known as: BENADRYL      INTEGRA PLUS Caps      TAKE these medications         acetaminophen 650 MG CR tablet   Commonly known as: TYLENOL   Take 650 mg by mouth at bedtime.      cholecalciferol 1000 UNITS tablet   Commonly known as: VITAMIN D   Take 1,000 Units by mouth 2 (two) times a week. Patient only takes when she will be at home because sometimes it causes loose stool.      CORICIDIN D PO   Take 1 tablet by mouth daily as needed. For cold symptoms      enoxaparin 100 MG/ML injection   Commonly known as: LOVENOX   Inject 0.7 mLs (70 mg total) into the skin every 12 (twelve) hours.      eprosartan 600 MG tablet   Commonly known as: TEVETEN   Take 600 mg by mouth daily.      ferrous sulfate 325 (65 FE) MG tablet   Take 1 tablet (325 mg total) by mouth 2 (two) times daily with a meal.      levothyroxine 125 MCG tablet   Commonly known as: SYNTHROID, LEVOTHROID   Take 62.5 mcg by mouth daily.      MUCINEX PO   Take 1 tablet by mouth daily as needed. For congestion      multivitamin with minerals Tabs   Take 1 tablet by mouth daily.      PARoxetine 10 MG tablet   Commonly known as: PAXIL   Take 10 mg by mouth daily.      warfarin 5 MG tablet   Commonly known as: COUMADIN   Take 1 tablet (5 mg total) by mouth daily.         Disposition and follow-up:   Destiny Williams was discharged from  [facility] in good condition.  At the hospital follow up visit please address  1) Anticoagulation in setting of new bilateral segmental pulmonary emboli Patient was discharged with 3 days of lovenox to complete 5 day bridge to warfarin. She would like to follow with Dr. Alexandria Lodge in the coag clinic. Please assess her INR and make adjustments to warfarin therapy as needed. 2) Iron deficient anemia Patient p/w Hb 8.4 with no reports of blood in stool and negative FOBT. She was noted to be significantly iron deficient and discharged on oral iron therapy. As pt had negative FOBT and no report of hematochezia/melena, the source of her blood loss is unclear. She refused most recent colonoscopy 2 years ago. If patient continues to have symptomatic anemia she may benefit from further GI workup, however risks of interventions must be weighed in light of patient's advanced age.    Follow-up Appointments: Follow-up Information    Follow up with Janie Morning, Ricke Hey, PHARMD. (You will be called to schedule an appointment with the coumadin clinic)  Contact information:   [address] [place] [address] [phone]       Follow up with [facility]. (You will be called to schedule an appointment for hospital follow-up.)    Contact information:   [address] [place] [address] [phone]        Discharge Orders    Future Orders Please Complete By Expires   Diet - low sodium heart healthy      Increase activity slowly      Call MD for:  difficulty breathing, headache or visual disturbances      Call MD for:  persistant dizziness or light-headedness      Call MD for:  extreme fatigue      Discharge instructions      Comments:   1. Please inject Lovenox 70mg  twice a day (every 12 hours) as instructed by the pharmacist. You will do this once more today, and then Monday and Tuesday. 2. Please take 1.5 tablets of warfarin tonight (total 7.5mg ).  Starting tomorrow, please take one tablet of warfarin (5mg ) daily. 3. You will be called to schedule an appointment in the coagulation clinic to monitor your INR (response to coumadin). 4. You will also be called to schedule a hospital follow up appointment in the Internal Medicine clinic.      Consultations:  none  Procedures Performed:  Dg Chest 2 View  08/29/2012  *RADIOLOGY REPORT*  Clinical Data: 78 year old female with weakness and shortness of breath.  CHEST - 2 VIEW  Comparison: None  Findings: Cardiomegaly is identified. A moderate to large hiatal hernia is present. Left basilar atelectasis/scarring is noted.  There is no evidence of focal airspace disease, pulmonary edema, suspicious pulmonary nodule/mass, pleural effusion, or pneumothorax. No acute bony abnormalities are identified.  IMPRESSION: Cardiomegaly with left basilar atelectasis/scarring.  Moderate to large hiatal hernia.   Original Report Authenticated By: Harmon Pier, M.D.    Dg Shoulder Right  08/29/2012  *RADIOLOGY REPORT*  Clinical Data: Right shoulder pain and bruising; question of fall.  RIGHT SHOULDER - 2+ VIEW  Comparison: None.  Findings: There is no evidence of fracture or dislocation.  The right humeral head is seated within the glenoid fossa, though there is superior subluxation of the humeral head, with associated mild cortical irregularity and mild bony remodelling of the right acromion.  Osteophytes are seen arising from the medial humeral head.  The acromioclavicular joint is unremarkable in appearance.  No significant soft tissue abnormalities are seen.  The visualized portions of the right lung are clear.  IMPRESSION:  1.  No evidence of fracture or dislocation. 2.  Chronic degenerative changes noted at the right glenohumeral joint, with superior subluxation of the right humeral head, reflecting remote rotator cuff injury.   Original Report Authenticated By: Tonia Ghent, M.D.    Ct Angio Chest Pe W/cm &/or Wo  Cm  08/30/2012  *RADIOLOGY REPORT*  Clinical Data: Shortness of breath.  CT ANGIOGRAPHY CHEST  Technique:  Multidetector CT imaging of the chest using the standard protocol during bolus administration of intravenous contrast. Multiplanar reconstructed images including MIPs were obtained and reviewed to evaluate the vascular anatomy.  Contrast: OMNIPAQUE IOHEXOL 350 MG/ML SOLN 100 ml Omnipaque- 300 IV  Comparison: None.  Findings: There is good contrast opacification of the pulmonary arteries.  Multiple segmental filling defects involving right middle lobe, posterior basal segment right lower lobe, posterior, medial, and lateral basal segments left lower lobe, lingula.  Small bilateral  pleural effusions.  There is early contrast opacification of the thoracic aorta without suggestion of dissection, aneurysm, or stenosis.  Patchy coarse coronary and aortic calcifications. Tiny pericardial effusion.  Sub centimeter prevascular, AP window, precarinal, and subcarinal lymph nodes.  No hilar adenopathy.  Mild dependent atelectasis posteriorly in both lower lobes.  There is moderate hiatal hernia.  Remainder of the visualized upper abdomen unremarkable.  Spondylitic changes in the mid thoracic spine.  IMPRESSION: 1.  Multiple bilateral segmental pulmonary emboli. 2. Atherosclerosis, including . coronary artery disease. Please note that although the presence of coronary artery calcium documents the presence of coronary artery disease, the severity of this disease and any potential stenosis cannot be assessed on this non-gated CT examination.  Assessment for potential risk factor modification, dietary therapy or pharmacologic therapy may be warranted, if clinically indicated. 3.  Moderate hiatal hernia.   Original Report Authenticated By: D. Andria Rhein, MD     Transthoracic echo: 08/30/12 Impressions - Left ventricle: Hypokinesis of the inferolateral wall at the base. The cavity size was normal. Wall thickness  was increased in a pattern of mild LVH. The estimated ejection fraction was 55%. Features are consistent with a pseudonormal left ventricular filling pattern, with concomitant abnormal relaxation and increased filling pressure (grade 2 diastolic dysfunction). - Mitral valve: Flat closure of the mitral valve. Mild regurgitation. - Right ventricle: The cavity size was mildly to moderately dilated. Systolic function was mildly to moderately reduced. - Right atrium: The atrium was mildly dilated. - Tricuspid valve: Mild-moderate regurgitation. - Pulmonary arteries: Systolic pressure was severely   Admission HPI:  Patient is a 78 y.o. female with a PMHx of iron-deficiency anemia, HTN, HLD, hypothyroidism who presents to [facility] for evaluation of shortness of breath. The patient is from [place], [place] and is here in [place] visiting family for the holidays. Prior to leaving [place], the patient had seen her PCP approximately 2 weeks ago for new-onset SOB at which time she had a hemoglobin of 7.7 and her PCP recommended that she undergo blood transfusion for symptomatic anemia. Patient declined this option, however she reports to the ED today after urging of her family members for further evaluation of her shortness of breath. The patient states her shortness of breath is worse with standing and walking, and is relieved with sitting or lying down. Denies orthopnea or PND. Denies leg swelling. Denies chest pain. She admits to a history of hemorrhoids with occasional BRBPR, but denies any hematochezia or melena.  The patient states that she has known anemia of approximately 2 years, for which she was started on iron supplementation at that time but declined colonoscopy. She states she took the iron supplements for one year, and then discontinued them. She states her Hb was ~10 one year ago, at the time she stopped taking her PO iron (again, had decreased to 7.7 as of 2 weeks ago).  Patient states she  had a TIA 20 years ago, and was continued on Plavix for the last 20 years, which has only been discontinued as of approximately one week ago. Her only other complaint is a right shoulder bruise with associated pain x ~2 weeks, which the patient is unsure of how she acquired, and denies any recent history of trauma.   Hospital Course by problem list: 1. Acute bilateral segmental pulmonary emboli Patient presented with SOB, dyspnea, and hypoxemia that was initially thought to be related anemia. She had intermediate Geneva Score (6). CTA of the chest was performed, and  demonstrated multiple bilateral segmental pulmonary emboli. Patient was placed on heparin drip and started warfarin therapy. Respiratory distress improved and she was weaned off of supplemental oxygen therapy. Echocardiogram was performed and demonstrated mildly reduced RV function but no overt clinical signs/symptoms of heart failure. On the day of discharge, heparin drip was discontinued and she was transitioned to lovenox 70mg  BID for additional 3 days of bridging to complete 5 days of bridging.  She maintained oxygen saturations of 93-96% on room air and >88% while ambulating. Daughter will be able to provide lovenox injections at home, education was provided. She was also discharged coumadin 5mg  daily. She will receive home health RN for INR checks as well as home health PT. She will follow up in the [facility] and w Dr. Alexandria Lodge in the coumadin clinic.     2. Iron deficiency anemia Patient's Hb on admission was 8.4 w MCV 70. FOBT was negative. She was transfused 1 U pRBCs on admission due to concern that SOB was manifestation of symptomatic anemia. Labs were consistent with Fe deficient anemia. FOBT negative on admission, no active bleeding other than some w hemorrhoids. Her Hb remained stable throughout admission, around 9.0. She was prescribed iron 325mg  BID to be continued upon discharge. As pt had negative FOBT and no report of  hematochezia/melena, the source of her blood loss is unclear. She refused most recent colonoscopy 2 years ago. If patient continues to have symptomatic anemia she may benefit from further GI workup, however risks of interventions must be weighed in light of patient's advanced age.   3. Grade 2 diastolic dysfunction Noted on echocardiogram. EF 55%. Elevated pro-BNP but no signs or symptoms of heart failure. We continued to treat her hypertension with irbesartan.  4. Hypertension Stable during admission. Continued ARB (irbesartan).  5. Hypothyroidism On synthroid at home, TSH 2.017. Continued home dose of 62. daily.   6. Degenerative joint disease - Right shoulder Patient complained of R shoulder pain with no history of trauma. Plain film of the R shoulder demonstrated chronic degenerative changes noted at right glenohumeral joint with superior subluxation of right humeral head, reflecting remote rotator cuff injury. Pain control was provided, evaluation by orthopedics or sports medicine may be warranted if pain cannot be controlled at home.    Discharge Vitals:  BP 121/69  Pulse 82  Temp 98.1 F (36.7 C) (Oral)  Resp 18  Ht 5\' 4"  (1.626 m)  Wt 150 lb 5.7 oz (68.2 kg)  BMI 25.81 kg/m2  SpO2 95%  Discharge Day PE Vitals reviewed.  General: resting in bed, NAD  HEENT: PERRL, EOMI, no scleral icterus  Cardiac: RRR, no rubs, murmurs or gallops  Pulm: No increased WOB on room air. Lungs with fine bilateral crackles, no wheezes or rhonchi.  Abd: soft, nontender, nondistended, BS present  Ext: warm and well perfused, no pedal edema, DP pulses 2+ bilaterally  Neuro: alert and oriented X3, cranial nerves II-XII grossly intact, strength and sensation to light touch equal in bilateral upper and lower extremities  Discharge Labs:  Results for orders placed during the hospital encounter of 08/29/12 (from the past 24 hour(s))  PROTIME-INR     Status: Abnormal   Collection Time   09/01/12   6:25 AM      Component Value Range   Prothrombin Time 15.8 (*) 11.6 - 15.2 seconds   INR 1.29  0.00 - 1.49  CBC     Status: Abnormal   Collection Time   09/01/12  6:25  AM      Component Value Range   WBC 10.8 (*) 4.0 - 10.5 K/uL   RBC 4.18  3.87 - 5.11 MIL/uL   Hemoglobin 9.1 (*) 12.0 - 15.0 g/dL   HCT 69.6 (*) 29.5 - 28.4 %   MCV 74.2 (*) 78.0 - 100.0 fL   MCH 21.8 (*) 26.0 - 34.0 pg   MCHC 29.4 (*) 30.0 - 36.0 g/dL   RDW 13.2 (*) 44.0 - 10.2 %   Platelets 261  150 - 400 K/uL  HEPARIN LEVEL (UNFRACTIONATED)     Status: Normal   Collection Time   09/01/12  6:25 AM      Component Value Range   Heparin Unfractionated 0.38  0.30 - 0.70 IU/mL  BASIC METABOLIC PANEL     Status: Abnormal   Collection Time   09/01/12  6:25 AM      Component Value Range   Sodium 139  135 - 145 mEq/L   Potassium 3.8  3.5 - 5.1 mEq/L   Chloride 105  96 - 112 mEq/L   CO2 24  19 - 32 mEq/L   Glucose, Bld 110 (*) 70 - 99 mg/dL   BUN 12  6 - 23 mg/dL   Creatinine, Ser 7.25  0.50 - 1.10 mg/dL   Calcium 8.7  8.4 - 36.6 mg/dL   GFR calc non Af Amer 73 (*) >90 mL/min   GFR calc Af Amer 84 (*) >90 mL/min    Signed: Bronson Curb 09/01/2012, 10:24 AM   Time Spent on Discharge: 35 min Services Ordered on Discharge: Home PT/RN Equipment Ordered on Discharge: none

## 2012-09-01 NOTE — Progress Notes (Signed)
Subjective: Feeling better this am. Ambulated and maintained sats >88%. Breathing comfortably. Discussed with daughter need for 3 more days lovenox shots at home, daughter willing to give. Family wants patient to live with them for at least a couple more months and then possibly transition to ALF. Denies CP, N/V, dizziness, abdominal pain, diarrhea.  Objective: Vital signs in last 24 hours: Filed Vitals:   08/31/12 1416 08/31/12 1422 08/31/12 2118 09/01/12 0434  BP:   112/62 121/69  Pulse: 113 93 91 82  Temp:   97.9 F (36.6 C) 98.1 F (36.7 C)  TempSrc:   Oral Oral  Resp:   16 18  Height:      Weight:    150 lb 5.7 oz (68.2 kg)  SpO2: 87% 96% 95% 95%   Weight change: -10.6 oz (-0.3 kg)  Intake/Output Summary (Last 24 hours) at 09/01/12 0931 Last data filed at 09/01/12 0857  Gross per 24 hour  Intake 1312.39 ml  Output   1750 ml  Net -437.61 ml   Vitals reviewed. General: resting in bed, NAD HEENT: PERRL, EOMI, no scleral icterus Cardiac: RRR, no rubs, murmurs or gallops Pulm: No increased WOB on room air.  Lungs with fine bilateral crackles, no wheezes or rhonchi.  Abd: soft, nontender, nondistended, BS present Ext: warm and well perfused, no pedal edema, DP pulses 2+ bilaterally  Neuro: alert and oriented X3, cranial nerves II-XII grossly intact, strength and sensation to light touch equal in bilateral upper and lower extremities  Lab Results: Basic Metabolic Panel:  Lab 09/01/12 7829 08/31/12 0525  NA 139 139  K 3.8 4.1  CL 105 105  CO2 24 25  GLUCOSE 110* 119*  BUN 12 12  CREATININE 0.77 0.79  CALCIUM 8.7 8.7  MG -- --  PHOS -- --   Liver Function Tests:  Lab 08/29/12 1516  AST 20  ALT 35  ALKPHOS 87  BILITOT 0.5  PROT 6.6  ALBUMIN 3.6   CBC:  Lab 09/01/12 0625 08/31/12 0525 08/29/12 1516  WBC 10.8* 13.0* --  NEUTROABS -- -- 11.0*  HGB 9.1* 9.0* --  HCT 31.0* 31.1* --  MCV 74.2* 73.9* --  PLT 261 270 --   Cardiac Enzymes:  Lab 08/29/12  1931  CKTOTAL --  CKMB --  CKMBINDEX --  TROPONINI <0.30   BNP:  Lab 08/29/12 1931  PROBNP 2982.0*   Thyroid Function Tests:  Lab 08/29/12 2127  TSH 2.017  T4TOTAL --  FREET4 --  T3FREE --  THYROIDAB --   Coagulation:  Lab 09/01/12 0625 08/31/12 0525 08/29/12 1523  LABPROT 15.8* 15.7* 15.6*  INR 1.29 1.28 1.27   Anemia Panel:  Lab 08/29/12 2127  VITAMINB12 640  FOLATE >20.0  FERRITIN 17  TIBC 389  IRON 10*  RETICCTPCT 4.1*   Urinalysis:  Lab 08/29/12 2312  COLORURINE YELLOW  LABSPEC 1.013  PHURINE 6.0  GLUCOSEU NEGATIVE  HGBUR NEGATIVE  BILIRUBINUR NEGATIVE  KETONESUR NEGATIVE  PROTEINUR 30*  UROBILINOGEN 0.2  NITRITE NEGATIVE  LEUKOCYTESUR NEGATIVE   Micro Results: Recent Results (from the past 240 hour(s))  MRSA PCR SCREENING     Status: Normal   Collection Time   08/29/12 11:02 PM      Component Value Range Status Comment   MRSA by PCR NEGATIVE  NEGATIVE Final    Studies/Results: Ct Angio Chest Pe W/cm &/or Wo Cm  08/30/2012  *RADIOLOGY REPORT*  Clinical Data: Shortness of breath.  CT ANGIOGRAPHY CHEST  Technique:  Multidetector  CT imaging of the chest using the standard protocol during bolus administration of intravenous contrast. Multiplanar reconstructed images including MIPs were obtained and reviewed to evaluate the vascular anatomy.  Contrast: OMNIPAQUE IOHEXOL 350 MG/ML SOLN 100 ml Omnipaque- 300 IV  Comparison: None.  Findings: There is good contrast opacification of the pulmonary arteries.  Multiple segmental filling defects involving right middle lobe, posterior basal segment right lower lobe, posterior, medial, and lateral basal segments left lower lobe, lingula.  Small bilateral pleural effusions.  There is early contrast opacification of the thoracic aorta without suggestion of dissection, aneurysm, or stenosis.  Patchy coarse coronary and aortic calcifications. Tiny pericardial effusion.  Sub centimeter prevascular, AP window,  precarinal, and subcarinal lymph nodes.  No hilar adenopathy.  Mild dependent atelectasis posteriorly in both lower lobes.  There is moderate hiatal hernia.  Remainder of the visualized upper abdomen unremarkable.  Spondylitic changes in the mid thoracic spine.  IMPRESSION: 1.  Multiple bilateral segmental pulmonary emboli. 2. Atherosclerosis, including . coronary artery disease. Please note that although the presence of coronary artery calcium documents the presence of coronary artery disease, the severity of this disease and any potential stenosis cannot be assessed on this non-gated CT examination.  Assessment for potential risk factor modification, dietary therapy or pharmacologic therapy may be warranted, if clinically indicated. 3.  Moderate hiatal hernia.   Original Report Authenticated By: D. Andria Rhein, MD    Medications: I have reviewed the patient's current medications. Scheduled Meds:    . enoxaparin (LOVENOX) injection  1 mg/kg Subcutaneous Q12H  . enoxaparin   Does not apply Once  . ferrous sulfate  325 mg Oral BID WC  . irbesartan  150 mg Oral Daily  . levothyroxine  62.5 mcg Oral QAC breakfast  . PARoxetine  10 mg Oral Daily  . sodium chloride  3 mL Intravenous Q12H  . warfarin  7.5 mg Oral ONCE-1800  . Warfarin - Pharmacist Dosing Inpatient   Does not apply q1800   Continuous Infusions:   PRN Meds:.acetaminophen, acetaminophen, celecoxib, ondansetron (ZOFRAN) IV, ondansetron, zolpidem Assessment/Plan: Destiny Williams is a 78 yo female with a PMHx of iron-deficiency anemia, HTN, HLD, hypothyroidism admitted for worsening SOB in setting of acute pulmonary embolism.    1) Acute PE CTA demonstrated multiple bilateral segmental pulmonary emboli. Patient placed on heparin drip and started warfarin therapy. Respiratory distress has improved. Some evidence of RV strain on echo, but no overt clinical signs/symptoms of heart failure. Maintaining oxygen saturations 93-96% on room air and  >88% while ambulating.  - Will transition heparin drip to lovenox 70mg  BID (per pharmacy) for additional 3 days of bridging. Pt to continue coumadin 5mg  daily at home. Will arrange home health INR checks and outpatient f/u w [facility] and Coumadin clinic Daughter will be able to provide lovenox injections at home.  - PT eval: home PT needs. Care mgmt referral made.   2) Anemia  Labs consistent with Fe deficient anemia. FOBT negative on admission, no active bleeding other than some w hemorrhoids. Was transfused 1U pRBCs on 08/29/12. Fe therapy started yesterday. Hb remains stable at 9.1 today.  - Continue iron 325mg  BID  3) Grade 2 diastolic dysfunc No history of CHF, no evidence of overt heart failure.  - Blood pressure control with home ARB  4) HTN Continue irbesartan 150mg  daily  5) HLD    states she was recommended to start a statin by her PCP in the past, but declined this intervention. -continue to  monitor   6) Hypothyroidism  On synthroid.  - TSH 2.017  DVT PPX - heparin CODE STATUS - DNR  CONSULTS PLACED - N/A  DISPO - Disposition is deferred at this time, awaiting symptomatic improvement.  Anticipated discharge in approximately 1-2day(s).  The patient will require OPC and coag clinic  follow-up after discharge.  The patient does not have transportation limitations that hinder transportation to clinic appointments.  .Services Needed at time of discharge: Y = Yes, Blank = No PT:   OT:   RN:   Equipment:   Other:     LOS: 3 days   Destiny Williams 09/01/2012, 9:31 AM

## 2012-09-02 ENCOUNTER — Other Ambulatory Visit: Payer: Self-pay | Admitting: *Deleted

## 2012-09-02 MED ORDER — EPROSARTAN MESYLATE 600 MG PO TABS: 600.0000 mg | ORAL_TABLET | Freq: Every day | ORAL | Status: AC

## 2012-09-02 MED ORDER — LEVOTHYROXINE SODIUM 125 MCG PO TABS: 62.5000 ug | ORAL_TABLET | Freq: Every day | ORAL | Status: AC

## 2012-09-02 MED ORDER — PAROXETINE HCL 10 MG PO TABS: 10.0000 mg | ORAL_TABLET | Freq: Every day | ORAL | Status: DC

## 2012-09-02 NOTE — Telephone Encounter (Signed)
Also pt's daughter just called and wanted to know which of her medicines to take and which not to take, importantly is she to continue the synthroid, teveten, paxil. If she is to continue these meds she will need refills sent to [facility]

## 2012-09-02 NOTE — Telephone Encounter (Signed)
I will refill these for now. She will need to continue eprosartan and synthroid. I will give one refill of paxil to prevent abrupt withdrawal symptoms and defer tapering or further management to her PCP.  Thank you

## 2012-09-02 NOTE — Telephone Encounter (Signed)
Per dr ziemer pt will need [facility] services this week for PT, INR then start care w/ [facility], she will need to be seen 09/09/2012 for dr groce and hosp f/u then dr Josem Kaufmann is to be her pcp there after. appt mon 1/6 at 1530 dr sadek [facility] and w/ dr groce at 1500 or before if schedule permits. i have called and LM on ph# listed for pt's daughter, will call again this pm

## 2012-09-02 NOTE — Progress Notes (Signed)
09/02/12 1530 Received TC from daughter, Berdie, about when [facility] would be coming to see the pt.  This NCM called Corrie Dandy [phone]), liason for [facility], and was told that the [facility] RN would come to draw pt.'s INR for her Coumadin on Wednesday 09/04/12 and the PT was to come to see pt. today.  Called the [place] back [phone]) to make her aware of when the disciplines would be coming to see pt. and gave her [facility]'s number for her information.   Tera Mater, RN, BSN NCM [phone]

## 2012-09-05 ENCOUNTER — Telehealth: Payer: Self-pay | Admitting: *Deleted

## 2012-09-05 DIAGNOSIS — R0600 Dyspnea, unspecified: Secondary | ICD-10-CM

## 2012-09-05 DIAGNOSIS — R0902 Hypoxemia: Secondary | ICD-10-CM

## 2012-09-05 DIAGNOSIS — I2699 Other pulmonary embolism without acute cor pulmonale: Secondary | ICD-10-CM

## 2012-09-05 NOTE — Telephone Encounter (Signed)
Pt had hypoxia in the hospital. No cause other than the multiple B PE's were found. Will Rx O2 and pt has F/U appt on the 6th

## 2012-09-05 NOTE — Telephone Encounter (Signed)
Faxed to [facility], fax# [phone], order for home [phone]

## 2012-09-05 NOTE — Telephone Encounter (Signed)
Pt's daughter calls and states that the [facility] came yesterday and told her that she should hold off until her visit with [facility], pt was not sent home w/ 02, PT came today and with very minute exertion, such as putting her legs down off bed her SATS dropped to 85-86%, it took appr 4 mins to recover, at one point it did for appr 1 min go to 97% but otherwise was around 87 to 92%, daughter states she has been waking in the night c/o shortness of breath and becoming restless. Her appetite continues to be poor. Pt does at times c/o dizziness. Please advise

## 2012-09-05 NOTE — Telephone Encounter (Signed)
I do not know Ms. Rockefeller but it appears she had a recent pulmonary embolism.  If those O2 saturations are accurate she would qualify for home O2.  I would recommend that she be seen in the clinic at the next opening and be assessed for candidacy for home O2.  If she qualifies it should be set up.  Thanks.

## 2012-09-06 NOTE — Progress Notes (Signed)
Received call from [employer], M[place]. States they received order for oxygen but needed information on qualifying sats. Faxed noted from MD's office to [facility]. Isidoro Donning RN CCM Case Mgmt phone [phone]

## 2012-09-09 ENCOUNTER — Ambulatory Visit: Payer: Medicare Other

## 2012-09-09 ENCOUNTER — Ambulatory Visit (INDEPENDENT_AMBULATORY_CARE_PROVIDER_SITE_OTHER): Payer: Medicare Other | Admitting: Pharmacist

## 2012-09-09 ENCOUNTER — Encounter: Payer: Self-pay | Admitting: Internal Medicine

## 2012-09-09 ENCOUNTER — Ambulatory Visit (INDEPENDENT_AMBULATORY_CARE_PROVIDER_SITE_OTHER): Payer: Medicare Other | Admitting: Internal Medicine

## 2012-09-09 VITALS — BP 136/79 | HR 105 | Temp 98.0°F | Ht 65.0 in | Wt 157.4 lb

## 2012-09-09 DIAGNOSIS — I2699 Other pulmonary embolism without acute cor pulmonale: Secondary | ICD-10-CM

## 2012-09-09 DIAGNOSIS — Z7901 Long term (current) use of anticoagulants: Secondary | ICD-10-CM

## 2012-09-09 DIAGNOSIS — E785 Hyperlipidemia, unspecified: Secondary | ICD-10-CM

## 2012-09-09 NOTE — Patient Instructions (Signed)
It was a pleasure to meet you today both Destiny Williams :)  You're improving greatly from your recent hospitalization and continue to followup with Dr. Alexandria Lodge in the warfarin clinic  We will be checking a hemoglobin today for your anemia and a lipid panel to assess your cholesterol.  If you were to experience worsening shortness of breath or coughing up of blood you should be seen immediately.

## 2012-09-09 NOTE — Patient Instructions (Signed)
Patient instructed to take medications as defined in the Anti-coagulation Track section of this encounter.  Patient instructed to take today's dose.  Patient verbalized understanding of these instructions.    

## 2012-09-09 NOTE — Progress Notes (Signed)
Subjective:   Patient ID: Destiny Williams female   DOB: [birth year] 79 y.o.   MRN: [id number]  HPI: Ms.Destiny Williams is a 79 y.o. woman past medical history of hypertension, hyperlipidemia, hypothyroidism, CTD stage II, and recently diagnosed bilateral PEs on 08/30/12 presenting for hospital followup. Patient was discharged on Lovenox for 3 days to bridge over to warfarin and just recently followed up in Coumadin clinic today. Since discharge patient has had some hypoxia at home verified by home health nurse descending into the high 80s and that returned to 95s with 2L O2. She only requires the O2 at night and feels refreshed afterwards. She has not had any hemoptysis, hematochezia, or hematemesis. She is able to take the iron without any GI distress. The patient is adamant about her wishes and would like to be DNR/DNI no artificial feeding and no artificial hydration. The patient is nonsmoker and feels exponentially better since her last hospitalization.   Past Medical History  Diagnosis Date  . HTN (hypertension)   . Hypothyroidism   . Hypercholesteremia   . Depression   . Anxiety   . Shortness of breath   . Anemia    Current Outpatient Prescriptions  Medication Sig Dispense Refill  . acetaminophen (TYLENOL) 650 MG CR tablet Take 650 mg by mouth at bedtime.      . Chlorphen-Pseudoephed-APAP (CORICIDIN D PO) Take 1 tablet by mouth daily as needed. For cold symptoms      . cholecalciferol (VITAMIN D) 1000 UNITS tablet Take 1,000 Units by mouth 2 (two) times a week. Patient only takes when she will be at home because sometimes it causes loose stool.      . enoxaparin (LOVENOX) 100 MG/ML injection Inject 0.7 mLs (70 mg total) into the skin every 12 (twelve) hours.  20 mL  0  . eprosartan (TEVETEN) 600 MG tablet Take 1 tablet (600 mg total) by mouth daily.  90 tablet  1  . ferrous sulfate 325 (65 FE) MG tablet Take 1 tablet (325 mg total) by mouth 2 (two) times daily with a meal.  60 tablet  5  .  GuaiFENesin (MUCINEX PO) Take 1 tablet by mouth daily as needed. For congestion      . levothyroxine (SYNTHROID, LEVOTHROID) 125 MCG tablet Take 0.5 tablets (62.5 mcg total) by mouth daily.  45 tablet  1  . Multiple Vitamin (MULTIVITAMIN WITH MINERALS) TABS Take 1 tablet by mouth daily.      Marland Kitchen PARoxetine (PAXIL) 10 MG tablet Take 1 tablet (10 mg total) by mouth daily.  30 tablet  0  . warfarin (COUMADIN) 5 MG tablet Take 1 tablet (5 mg total) by mouth daily.  30 tablet  1   No family history on file. History   Social History  . Marital Status: Single    Spouse Name: N/A    Number of Children: N/A  . Years of Education: N/A   Social History Main Topics  . Smoking status: Never Smoker   . Smokeless tobacco: Never Used  . Alcohol Use: Yes     Comment: socially - wine.  . Drug Use: No  . Sexually Active: None   Other Topics Concern  . None   Social History Narrative   Lives in [place], [place].  Her PCP is Huntley Estelle.   Review of Systems: otherwise negative unless listed in HPI  Objective:  Physical Exam: Filed Vitals:   09/09/12 1556  BP: 136/79  Pulse: 105  Temp: 98 F (36.7 C)  TempSrc: Oral  Height: 5\' 5"  (1.651 m)  Weight: 157 lb 6.4 oz (71.396 kg)  SpO2: 92%   General: sitting in wheelchair, NAD HEENT: PERRL, EOMI, no scleral icterus Cardiac: RRR, no rubs, murmurs or gallops Pulm: clear to auscultation bilaterally, no crackles or wheezes, moving normal volumes of air Abd: soft, nontender, nondistended, BS present Ext: warm and well perfused, no pedal edema Neuro: alert and oriented X3, cranial nerves II-XII grossly intact  Assessment & Plan:  1. Bilateral PE: Pt was evaluated in coumadin clinic today before visit and INR was 2.0. Patient has been compliant with warfarin and had no visible signs of bleeding. Patient reports improvement in shortness of breath since home oxygen and only requires it during exertion and sleep. -Continue warfarin -Continue home  oxygen  2.anemia: Patient states that around 08/28/12 she presented to an outside ED in [facility] secondary to some fatigue and weakness and was found to have a hemoglobin of 7.7 and transfused 1 unit of blood. During hospitalization patient was found to be anemic with unknown etiology and patient refuses diagnostic EGD and colonoscopy. Patient has been taking iron since that time without GI distress and patient has no gross signs of bleeding. -Continue iron -CBC ordered  3.hyperlipidemia: Patient also states that at this visit with PCP she was advised to begin a statin given some hyperlipidemia. Patient was given a free sample but did not take those and is interested in knowing lipid profile and whether a statin is necessary.  Pt was discussed with Dr. Rogelia Boga.

## 2012-09-09 NOTE — Progress Notes (Signed)
Anti-Coagulation Progress Note  Destiny Williams is a 79 y.o. female who is currently on an anti-coagulation regimen.    RECENT RESULTS: Recent results are below, the most recent result is correlated with a dose of alternating 5mg /2.5mg  qod with one omitted dose last Friday due to high INR. INR has drifted down nicely, will resume dosing as shown. Lab Results  Component Value Date   INR 2.0 09/09/2012   INR 1.29 09/01/2012   INR 1.28 08/31/2012    ANTI-COAG DOSE:   Latest dosing instructions   Total Sun Mon Tue Wed Thu Fri Sat   27.5 2.5 mg 5 mg 5 mg 2.5 mg 5 mg 2.5 mg 5 mg    (5 mg0.5) (5 mg1) (5 mg1) (5 mg0.5) (5 mg1) (5 mg0.5) (5 mg1)         ANTICOAG SUMMARY: Anticoagulation Episode Summary              Current INR goal 2.0-3.0 Next INR check 09/16/2012   INR from last check 2.0 (09/09/2012)     Weekly max dose (mg)  Target end date 09/09/2013   Indications Pulmonary embolus [415.19], Long term (current) use of anticoagulants [V58.61]   INR check location [facility] Preferred lab    Send INR reminders to    Comments Recent discharge from hospital with diagnosis of acute PE, unprovoked. Will plan to treat for 6 - 12 months based upon current recommendations in CHEST. This is FIRST known unprovoked VTE.            ANTICOAG TODAY: Anticoagulation Summary as of 09/09/2012              INR goal 2.0-3.0     Selected INR 2.0 (09/09/2012) Next INR check 09/16/2012   Weekly max dose (mg)  Target end date 09/09/2013   Indications Pulmonary embolus [415.19], Long term (current) use of anticoagulants [V58.61]    Anticoagulation Episode Summary              INR check location [facility] Preferred lab    Send INR reminders to    Comments Recent discharge from hospital with diagnosis of acute PE, unprovoked. Will plan to treat for 6 - 12 months based upon current recommendations in CHEST. This is FIRST known unprovoked VTE.            PATIENT INSTRUCTIONS: Patient  Instructions  Patient instructed to take medications as defined in the Anti-coagulation Track section of this encounter.  Patient instructed to take today's dose.  Patient verbalized understanding of these instructions.        FOLLOW-UP Return in 7 days (on 09/16/2012) for Follow up INR at 3PM.  Hulen Luster, III Pharm.D., CACP

## 2012-09-10 LAB — CBC
MCHC: 28.9 g/dL — ABNORMAL LOW (ref 30.0–36.0)
RDW: 25.5 % — ABNORMAL HIGH (ref 11.5–15.5)
WBC: 9.6 10*3/uL (ref 4.0–10.5)

## 2012-09-10 LAB — LIPID PANEL
Cholesterol: 168 mg/dL (ref 0–200)
HDL: 41 mg/dL (ref >39)
Triglycerides: 123 mg/dL (ref <150)

## 2012-09-11 ENCOUNTER — Encounter: Payer: Self-pay | Admitting: Internal Medicine

## 2012-09-16 ENCOUNTER — Ambulatory Visit (INDEPENDENT_AMBULATORY_CARE_PROVIDER_SITE_OTHER): Payer: Medicare Other | Admitting: Pharmacist

## 2012-09-16 DIAGNOSIS — I2699 Other pulmonary embolism without acute cor pulmonale: Secondary | ICD-10-CM

## 2012-09-16 DIAGNOSIS — Z7901 Long term (current) use of anticoagulants: Secondary | ICD-10-CM

## 2012-09-16 NOTE — Progress Notes (Signed)
Anti-Coagulation Progress Note  Destiny Williams is a 79 y.o. female who is currently on an anti-coagulation regimen.    RECENT RESULTS: Recent results are below, the most recent result is correlated with a dose of 27.5 mg. per week: Lab Results  Component Value Date   INR 2.2 09/16/2012   INR 2.0 09/09/2012   INR 1.29 09/01/2012    ANTI-COAG DOSE:   Latest dosing instructions   Total Sun Mon Tue Wed Thu Fri Sat   30 2.5 mg 5 mg 5 mg 2.5 mg 5 mg 5 mg 5 mg    (5 mg0.5) (5 mg1) (5 mg1) (5 mg0.5) (5 mg1) (5 mg1) (5 mg1)         ANTICOAG SUMMARY: Anticoagulation Episode Summary              Current INR goal 2.0-3.0 Next INR check 09/30/2012   INR from last check 2.2 (09/16/2012)     Weekly max dose (mg)  Target end date 09/09/2013   Indications Pulmonary embolus [415.19], Long term (current) use of anticoagulants [V58.61]   INR check location [facility] Preferred lab    Send INR reminders to    Comments Recent discharge from hospital with diagnosis of acute PE, unprovoked. Will plan to treat for 6 - 12 months based upon current recommendations in CHEST. This is FIRST known unprovoked VTE.            ANTICOAG TODAY: Anticoagulation Summary as of 09/16/2012              INR goal 2.0-3.0     Selected INR 2.2 (09/16/2012) Next INR check 09/30/2012   Weekly max dose (mg)  Target end date 09/09/2013   Indications Pulmonary embolus [415.19], Long term (current) use of anticoagulants [V58.61]    Anticoagulation Episode Summary              INR check location [facility] Preferred lab    Send INR reminders to    Comments Recent discharge from hospital with diagnosis of acute PE, unprovoked. Will plan to treat for 6 - 12 months based upon current recommendations in CHEST. This is FIRST known unprovoked VTE.            PATIENT INSTRUCTIONS: Patient Instructions  Patient instructed to take medications as defined in the Anti-coagulation Track section of this encounter.    Patient instructed to take today's dose.  Patient verbalized understanding of these instructions.        FOLLOW-UP Return in 2 weeks (on 09/30/2012) for Follow up INR at 3:30PM.  Hulen Luster, III Pharm.D., CACP

## 2012-09-16 NOTE — Patient Instructions (Addendum)
Patient instructed to take medications as defined in the Anti-coagulation Track section of this encounter.  Patient instructed to take today's dose.  Patient verbalized understanding of these instructions.    

## 2012-09-28 ENCOUNTER — Other Ambulatory Visit: Payer: Self-pay | Admitting: Internal Medicine

## 2012-09-30 ENCOUNTER — Ambulatory Visit (INDEPENDENT_AMBULATORY_CARE_PROVIDER_SITE_OTHER): Payer: Medicare Other | Admitting: Pharmacist

## 2012-09-30 DIAGNOSIS — D649 Anemia, unspecified: Secondary | ICD-10-CM

## 2012-09-30 DIAGNOSIS — Z7901 Long term (current) use of anticoagulants: Secondary | ICD-10-CM

## 2012-09-30 DIAGNOSIS — I2699 Other pulmonary embolism without acute cor pulmonale: Secondary | ICD-10-CM

## 2012-09-30 LAB — CBC
HCT: 44.2 % (ref 36.0–46.0)
Hemoglobin: 13.6 g/dL (ref 12.0–15.0)
MCV: 84 fL (ref 78.0–100.0)
RBC: 5.26 MIL/uL — ABNORMAL HIGH (ref 3.87–5.11)
WBC: 7.7 10*3/uL (ref 4.0–10.5)

## 2012-09-30 NOTE — Progress Notes (Signed)
Anti-Coagulation Progress Note  Destiny Williams is a 79 y.o. female who is currently on an anti-coagulation regimen.    RECENT RESULTS: Recent results are below, the most recent result is correlated with a dose of 32.5 mg. per week: Lab Results  Component Value Date   INR 3.30 09/30/2012   INR 2.2 09/16/2012   INR 2.0 09/09/2012    ANTI-COAG DOSE:   Latest dosing instructions   Total Sun Mon Tue Wed Thu Fri Sat   30 5 mg 2.5 mg 5 mg 5 mg 2.5 mg 5 mg 5 mg    (5 mg1) (5 mg0.5) (5 mg1) (5 mg1) (5 mg0.5) (5 mg1) (5 mg1)         ANTICOAG SUMMARY: Anticoagulation Episode Summary              Current INR goal 2.0-3.0 Next INR check 10/21/2012   INR from last check 2.2 (09/16/2012) Most recent INR 3.30! (09/30/2012)   Weekly max dose (mg)  Target end date 09/09/2013   Indications Pulmonary embolus [415.19], Long term (current) use of anticoagulants [V58.61]   INR check location [facility] Preferred lab    Send INR reminders to    Comments Recent discharge from hospital with diagnosis of acute PE, unprovoked. Will plan to treat for 6 - 12 months based upon current recommendations in CHEST. This is FIRST known unprovoked VTE.            ANTICOAG TODAY: Anticoagulation Summary as of 09/30/2012              INR goal 2.0-3.0     Selected INR  Next INR check 10/21/2012   Weekly max dose (mg)  Target end date 09/09/2013   Indications Pulmonary embolus [415.19], Long term (current) use of anticoagulants [V58.61]    Anticoagulation Episode Summary              INR check location [facility] Preferred lab    Send INR reminders to    Comments Recent discharge from hospital with diagnosis of acute PE, unprovoked. Will plan to treat for 6 - 12 months based upon current recommendations in CHEST. This is FIRST known unprovoked VTE.            PATIENT INSTRUCTIONS: Patient Instructions  Patient instructed to take medications as defined in the Anti-coagulation Track section of this  encounter.  Patient instructed to take today's dose.  Patient verbalized understanding of these instructions.        FOLLOW-UP Return in 3 weeks (on 10/21/2012) for Follow up INR at 4PM.  Hulen Luster, III Pharm.D., CACP

## 2012-09-30 NOTE — Patient Instructions (Signed)
Patient instructed to take medications as defined in the Anti-coagulation Track section of this encounter.  Patient instructed to take today's dose.  Patient verbalized understanding of these instructions.    

## 2012-10-02 ENCOUNTER — Encounter: Payer: Self-pay | Admitting: Internal Medicine

## 2012-10-14 ENCOUNTER — Telehealth: Payer: Self-pay | Admitting: *Deleted

## 2012-10-14 ENCOUNTER — Ambulatory Visit (INDEPENDENT_AMBULATORY_CARE_PROVIDER_SITE_OTHER): Payer: Medicare Other | Admitting: Pharmacist

## 2012-10-14 ENCOUNTER — Ambulatory Visit (INDEPENDENT_AMBULATORY_CARE_PROVIDER_SITE_OTHER): Payer: Medicare Other | Admitting: Internal Medicine

## 2012-10-14 ENCOUNTER — Encounter: Payer: Self-pay | Admitting: Internal Medicine

## 2012-10-14 VITALS — BP 113/71 | HR 102 | Temp 96.8°F | Ht 65.0 in | Wt 147.7 lb

## 2012-10-14 DIAGNOSIS — Z7901 Long term (current) use of anticoagulants: Secondary | ICD-10-CM

## 2012-10-14 DIAGNOSIS — I2699 Other pulmonary embolism without acute cor pulmonale: Secondary | ICD-10-CM

## 2012-10-14 DIAGNOSIS — I10 Essential (primary) hypertension: Secondary | ICD-10-CM

## 2012-10-14 DIAGNOSIS — R58 Hemorrhage, not elsewhere classified: Secondary | ICD-10-CM

## 2012-10-14 DIAGNOSIS — I998 Other disorder of circulatory system: Secondary | ICD-10-CM

## 2012-10-14 LAB — CBC
HCT: 45.9 % (ref 36.0–46.0)
Hemoglobin: 14.9 g/dL (ref 12.0–15.0)
MCV: 81.7 fL (ref 78.0–100.0)
Platelets: 243 10*3/uL (ref 150–400)
RBC: 5.62 MIL/uL — ABNORMAL HIGH (ref 3.87–5.11)
WBC: 8 10*3/uL (ref 4.0–10.5)

## 2012-10-14 NOTE — Telephone Encounter (Signed)
Pt's daughter calls and states pt has this large area of blood under the skin on her upper arm and it is growing, she has not had any injuries that either of them recall. appt is made for after coumadin clinic visit this pm, dr Dorthula Rue at [phone]

## 2012-10-14 NOTE — Progress Notes (Signed)
Subjective:   Patient ID: Destiny Williams female   DOB: 1921/10/13 79 y.o.   MRN: [id number]  HPI: 79 year old woman with past medical history significant for unprovoked PE on anticoagulation, hypertension, hypothyroidism presents to the clinic for evaluation of right upper arm ecchymosis.  Patient is accompanied by her daughter who takes care of her. History is obtained from both the patient and her daughter.  Daughter reports that patient was watching TV last night and reported that her right arm was hurting. Her daughter examined the  arm and noted a large bruise/ blood collection in the upper part of the arm near the shoulder joint and got really scared and called the clinic next morning for an appointment.  Her daughter reports that the patient had similar bruises x2 between September and December after her flu shot , when she was not living with her but another companion in [place], [place] who told her about the findings on the phone.  Of note the patient was on plavix at that time.  Patient reports having severe pain on her right arm , rates her pain 6-8/10 , gets worse with movement . Denies any history of trauma or injury or systemic symptoms like fever, chills, nausea or vomiting.   Past Medical History  Diagnosis Date  . HTN (hypertension)   . Hypothyroidism   . Hypercholesteremia   . Depression   . Anxiety   . Shortness of breath   . Anemia    No family history on file. History   Social History  . Marital Status: Single    Spouse Name: N/A    Number of Children: N/A  . Years of Education: N/A   Occupational History  . Not on file.   Social History Main Topics  . Smoking status: Never Smoker   . Smokeless tobacco: Never Used  . Alcohol Use: Yes     Comment: socially - wine.  . Drug Use: No  . Sexually Active: Not on file   Other Topics Concern  . Not on file   Social History Narrative   Lives in [place], [place].  Her PCP is Huntley Estelle.   Review of  Systems: General: Denies fever, chills, diaphoresis, appetite change and fatigue. HEENT: Denies photophobia, eye pain, redness, hearing loss, ear pain, congestion, sore throat, rhinorrhea, sneezing, mouth sores, trouble swallowing, neck pain, neck stiffness and tinnitus. Respiratory: Denies SOB, DOE, cough, chest tightness, and wheezing. Cardiovascular: Denies to chest pain, palpitations and leg swelling. Gastrointestinal: Denies nausea, vomiting, abdominal pain, diarrhea, constipation, blood in stool and abdominal distention. Genitourinary: Denies dysuria, urgency, frequency, hematuria, flank pain and difficulty urinating. Musculoskeletal: Denies myalgias, back pain, joint swelling, arthralgias and gait problem.  Skin: Denies pallor, rash and wound, + ecchymosis. Neurological: Denies dizziness, seizures, syncope, weakness, light-headedness, numbness and headaches. Hematological: Denies adenopathy, easy bruising, personal or family bleeding history. Psychiatric/Behavioral: Denies suicidal ideation, mood changes, confusion, nervousness, sleep disturbance and agitation.    Current Outpatient Medications: Current Outpatient Prescriptions  Medication Sig Dispense Refill  . acetaminophen (TYLENOL) 650 MG CR tablet Take 650 mg by mouth at bedtime.      . Chlorphen-Pseudoephed-APAP (CORICIDIN D PO) Take 1 tablet by mouth daily as needed. For cold symptoms      . cholecalciferol (VITAMIN D) 1000 UNITS tablet Take 1,000 Units by mouth 2 (two) times a week. Patient only takes when she will be at home because sometimes it causes loose stool.      . enoxaparin (LOVENOX) 100 MG/ML  injection Inject 0.7 mLs (70 mg total) into the skin every 12 (twelve) hours.  20 mL  0  . eprosartan (TEVETEN) 600 MG tablet Take 1 tablet (600 mg total) by mouth daily.  90 tablet  1  . ferrous sulfate 325 (65 FE) MG tablet Take 1 tablet (325 mg total) by mouth 2 (two) times daily with a meal.  60 tablet  5  . GuaiFENesin  (MUCINEX PO) Take 1 tablet by mouth daily as needed. For congestion      . levothyroxine (SYNTHROID, LEVOTHROID) 125 MCG tablet Take 0.5 tablets (62.5 mcg total) by mouth daily.  45 tablet  1  . Multiple Vitamin (MULTIVITAMIN WITH MINERALS) TABS Take 1 tablet by mouth daily.      Marland Kitchen PARoxetine (PAXIL) 10 MG tablet Take 1 tablet (10 mg total) by mouth every morning.  30 tablet  3  . warfarin (COUMADIN) 5 MG tablet Take 1 tablet (5 mg total) by mouth daily.  30 tablet  1   No current facility-administered medications for this visit.    Allergies: Allergies  Allergen Reactions  . Codeine Nausea And Vomiting  . Other     MSG      Objective:   Physical Exam: Filed Vitals:   10/14/12 1626  BP: 113/71  Pulse: 102  Temp: 96.8 F (36 C)    General: Vital signs reviewed and noted. Well-developed, well-nourished, in no acute distress; alert, appropriate and cooperative throughout examination. Head: Normocephalic, atraumatic Lungs: Normal respiratory effort. Clear to auscultation BL without crackles or wheezes. Heart: RRR. S1 and S2 normal without gallop, murmur, or rubs. Abdomen:BS normoactive. Soft, Nondistended, non-tender.  No masses or organomegaly. MSK:  Right arm near the shoulder joint has a large oval shaped about 10 cm diameter ecchymosis + , slightly warm and tender to palpation with restricted ROM.      Assessment & Plan:

## 2012-10-14 NOTE — Patient Instructions (Signed)
Patient instructed to take medications as defined in the Anti-coagulation Track section of this encounter.  Patient instructed to OMIT/DO NOT TAKE today's dose of warfarin.  Patient verbalized understanding of these instructions.

## 2012-10-14 NOTE — Patient Instructions (Addendum)
General Instructions: Please keep up with your appointment with Dr. Josem Kaufmann . Please bring your medication bottles with your next appointment. Please take your medicines as prescribed. I will call you with your lab results if anything will be abnormal.    Treatment Goals:  Goals (1 Years of Data) as of 10/14/12   None      Progress Toward Treatment Goals:  Treatment Goal 10/14/2012  Blood pressure at goal    Self Care Goals & Plans:  Self Care Goal 10/14/2012  Manage my medications take my medicines as prescribed; refill my medications on time  Eat healthy foods eat foods that are low in salt; eat baked foods instead of fried foods       Care Management & Community Referrals:  Referral 10/14/2012  Referrals made for care management support none needed

## 2012-10-14 NOTE — Progress Notes (Signed)
Agree 

## 2012-10-14 NOTE — Progress Notes (Signed)
Anti-Coagulation Progress Note  Destiny Williams is a 79 y.o. female who is currently on an anti-coagulation regimen.    RECENT RESULTS: Recent results are below, the most recent result is correlated with a dose of 30 mg. per week: Lab Results  Component Value Date   INR 4.50 10/14/2012   INR 3.30 09/30/2012   INR 2.2 09/16/2012    ANTI-COAG DOSE: Anticoagulation Dose Instructions as of 10/14/2012     Destiny Williams Tue Wed Thu Fri Sat   New Dose 2.5 mg 5 mg 2.5 mg 5 mg 2.5 mg 5 mg 2.5 mg    Description       Will OMIT today's dose.        ANTICOAG SUMMARY: Anticoagulation Episode Summary   Current INR goal 2.0-3.0  Next INR check 10/21/2012  INR from last check 4.50! (10/14/2012)  Weekly max dose   Target end date 09/09/2013  INR check location [facility]  Preferred lab   Send INR reminders to    Indications  Pulmonary embolus [415.19] Long term (current) use of anticoagulants [V58.61]        Comments Recent discharge from hospital with diagnosis of acute PE, unprovoked. Will plan to treat for 6 - 12 months based upon current recommendations in CHEST. This is FIRST known unprovoked VTE.        ANTICOAG TODAY: Anticoagulation Summary as of 10/14/2012   INR goal 2.0-3.0  Selected INR 4.50! (10/14/2012)  Next INR check 10/21/2012  Target end date 09/09/2013   Indications  Pulmonary embolus [415.19] Long term (current) use of anticoagulants [V58.61]      Anticoagulation Episode Summary   INR check location [facility]   Preferred lab    Send INR reminders to    Comments Recent discharge from hospital with diagnosis of acute PE, unprovoked. Will plan to treat for 6 - 12 months based upon current recommendations in CHEST. This is FIRST known unprovoked VTE.      PATIENT INSTRUCTIONS: Patient Instructions  Patient instructed to take medications as defined in the Anti-coagulation Track section of this encounter.  Patient instructed to OMIT/DO NOT TAKE today's dose of  warfarin.  Patient verbalized understanding of these instructions.       FOLLOW-UP Return in 7 days (on 10/21/2012) for Follow up INR at 4PM.  Destiny Williams, III Pharm.D., CACP

## 2012-10-15 NOTE — Assessment & Plan Note (Signed)
She presents with ecchymosis on her right upper arm in the setting of supratherapeutic INR( her INR was 4.5) today. Her H and H along with the platelets have been stable in the past.Patient was seen by Dr. Alexandria Lodge who adjusted the dose for Coumadin. Patient was advised to hold Coumadin for today and decreased the Coumadin to 25 mg from 30 mg for the whole next week. - We repeated CBC and it again showed normal H&H and platelets. - Apply ice to the affected area. - Tylenol for pain - Call the clinic if it progresses in size or becomes more painful.

## 2012-10-15 NOTE — Assessment & Plan Note (Signed)
Blood Pressure very well controlled. Continue current medications.

## 2012-10-19 ENCOUNTER — Other Ambulatory Visit: Payer: Self-pay

## 2012-10-21 ENCOUNTER — Ambulatory Visit (INDEPENDENT_AMBULATORY_CARE_PROVIDER_SITE_OTHER): Payer: Medicare Other | Admitting: Pharmacist

## 2012-10-21 DIAGNOSIS — Z7901 Long term (current) use of anticoagulants: Secondary | ICD-10-CM

## 2012-10-21 DIAGNOSIS — I2699 Other pulmonary embolism without acute cor pulmonale: Secondary | ICD-10-CM

## 2012-10-21 NOTE — Progress Notes (Signed)
Anti-Coagulation Progress Note  Destiny Williams is a 79 y.o. female who is currently on an anti-coagulation regimen.    RECENT RESULTS: Recent results are below, the most recent result is correlated with a dose of 25 mg. per week: Lab Results  Component Value Date   INR 2.40 10/21/2012   INR 4.50 10/14/2012   INR 3.30 09/30/2012    ANTI-COAG DOSE: Anticoagulation Dose Instructions as of 10/21/2012     Destiny Williams Tue Wed Thu Fri Sat   New Dose 2.5 mg 5 mg 2.5 mg 5 mg 2.5 mg 5 mg 2.5 mg       ANTICOAG SUMMARY: Anticoagulation Episode Summary   Current INR goal 2.0-3.0  Next INR check 11/11/2012  INR from last check 2.40 (10/21/2012)  Weekly max dose   Target end date 09/09/2013  INR check location [facility]  Preferred lab   Send INR reminders to    Indications  Pulmonary embolus [415.19] Long term (current) use of anticoagulants [V58.61]        Comments Recent discharge from hospital with diagnosis of acute PE, unprovoked. Will plan to treat for 6 - 12 months based upon current recommendations in CHEST. This is FIRST known unprovoked VTE.        ANTICOAG TODAY: Anticoagulation Summary as of 10/21/2012   INR goal 2.0-3.0  Selected INR 2.40 (10/21/2012)  Next INR check 11/11/2012  Target end date 09/09/2013   Indications  Pulmonary embolus [415.19] Long term (current) use of anticoagulants [V58.61]      Anticoagulation Episode Summary   INR check location [facility]   Preferred lab    Send INR reminders to    Comments Recent discharge from hospital with diagnosis of acute PE, unprovoked. Will plan to treat for 6 - 12 months based upon current recommendations in CHEST. This is FIRST known unprovoked VTE.      PATIENT INSTRUCTIONS: Patient Instructions  Patient instructed to take medications as defined in the Anti-coagulation Track section of this encounter.  Patient instructed to take today's dose.  Patient verbalized understanding of these instructions.        FOLLOW-UP Return in 3 weeks (on 11/11/2012) for Follow up INR at 4PM.  Hulen Luster, III Pharm.D., CACP

## 2012-10-21 NOTE — Patient Instructions (Addendum)
Patient instructed to take medications as defined in the Anti-coagulation Track section of this encounter.  Patient instructed to take today's dose.  Patient verbalized understanding of these instructions.    

## 2012-11-06 ENCOUNTER — Other Ambulatory Visit: Payer: Self-pay | Admitting: *Deleted

## 2012-11-06 DIAGNOSIS — I2699 Other pulmonary embolism without acute cor pulmonale: Secondary | ICD-10-CM

## 2012-11-07 MED ORDER — WARFARIN SODIUM 5 MG PO TABS: ORAL_TABLET | ORAL | Status: AC

## 2012-11-11 ENCOUNTER — Ambulatory Visit: Payer: BC Managed Care – PPO

## 2012-11-15 ENCOUNTER — Ambulatory Visit: Payer: BC Managed Care – PPO | Admitting: Internal Medicine

## 2012-12-05 NOTE — Progress Notes (Signed)
I have reviewed the note by Dr. Alexandria Lodge.  Indication for anticoagulation is PE.  I agree with plan as documented.

## 2013-02-17 ENCOUNTER — Other Ambulatory Visit: Payer: Self-pay | Admitting: Internal Medicine

## 2013-02-17 NOTE — Telephone Encounter (Signed)
Deny, pt has transferred care to [facility].

## 2013-02-17 NOTE — Telephone Encounter (Signed)
Patient has transferred the management of her anticoagulation to another provider - this was confirmed by Dr. Alexandria Lodge today.

## 2013-04-09 ENCOUNTER — Other Ambulatory Visit: Payer: Self-pay

## 2013-07-10 ENCOUNTER — Other Ambulatory Visit: Payer: Self-pay

## 2013-10-23 ENCOUNTER — Other Ambulatory Visit: Payer: Self-pay | Admitting: Family Medicine

## 2013-10-23 ENCOUNTER — Ambulatory Visit
Admission: RE | Admit: 2013-10-23 | Discharge: 2013-10-23 | Disposition: A | Discharge status: Home / Self Care | Payer: Medicare Other | Source: Ambulatory Visit | Attending: Family Medicine | Admitting: Family Medicine

## 2013-10-23 DIAGNOSIS — I739 Peripheral vascular disease, unspecified: Secondary | ICD-10-CM

## 2013-10-23 DIAGNOSIS — R0600 Dyspnea, unspecified: Secondary | ICD-10-CM

## 2013-10-29 ENCOUNTER — Ambulatory Visit
Admission: RE | Admit: 2013-10-29 | Discharge: 2013-10-29 | Disposition: A | Discharge status: Home / Self Care | Payer: Medicare Other | Source: Ambulatory Visit | Attending: Family Medicine | Admitting: Family Medicine

## 2013-10-29 DIAGNOSIS — I739 Peripheral vascular disease, unspecified: Secondary | ICD-10-CM

## 2013-11-12 ENCOUNTER — Other Ambulatory Visit: Payer: Self-pay | Admitting: Family Medicine

## 2013-11-12 ENCOUNTER — Ambulatory Visit
Admission: RE | Admit: 2013-11-12 | Discharge: 2013-11-12 | Disposition: A | Discharge status: Home / Self Care | Payer: Medicare Other | Source: Ambulatory Visit | Attending: Family Medicine | Admitting: Family Medicine

## 2013-11-12 DIAGNOSIS — I519 Heart disease, unspecified: Secondary | ICD-10-CM

## 2013-11-20 ENCOUNTER — Other Ambulatory Visit: Payer: Self-pay | Admitting: Internal Medicine

## 2014-01-17 IMAGING — CR DG CHEST 2V
2 series · 2 of 2 positions shown · non-contrast
Comparison: None

CLINICAL DATA: 89-year-old female with weakness and shortness of
breath.

CHEST - 2 VIEW

[w chest lat]
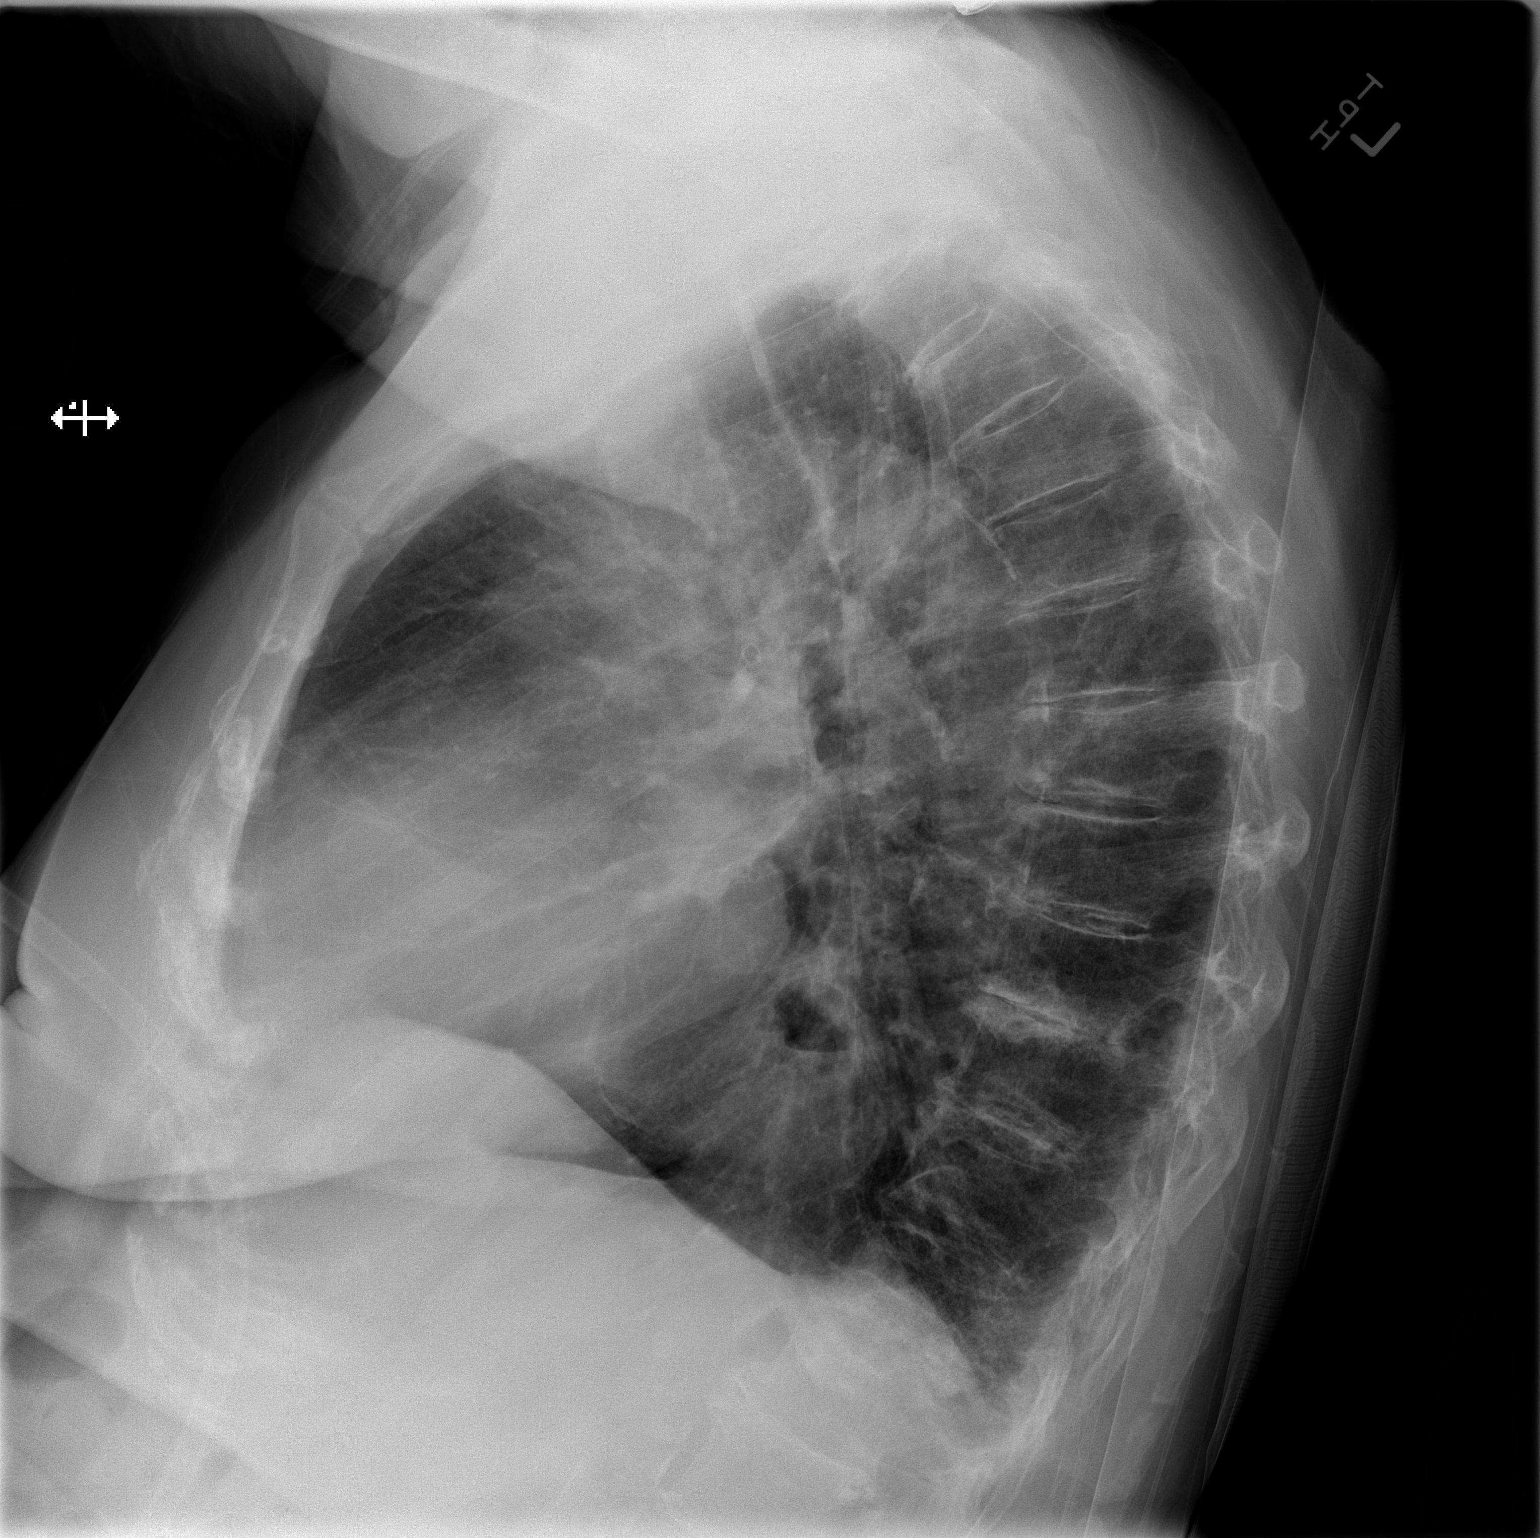

[x chest ap]
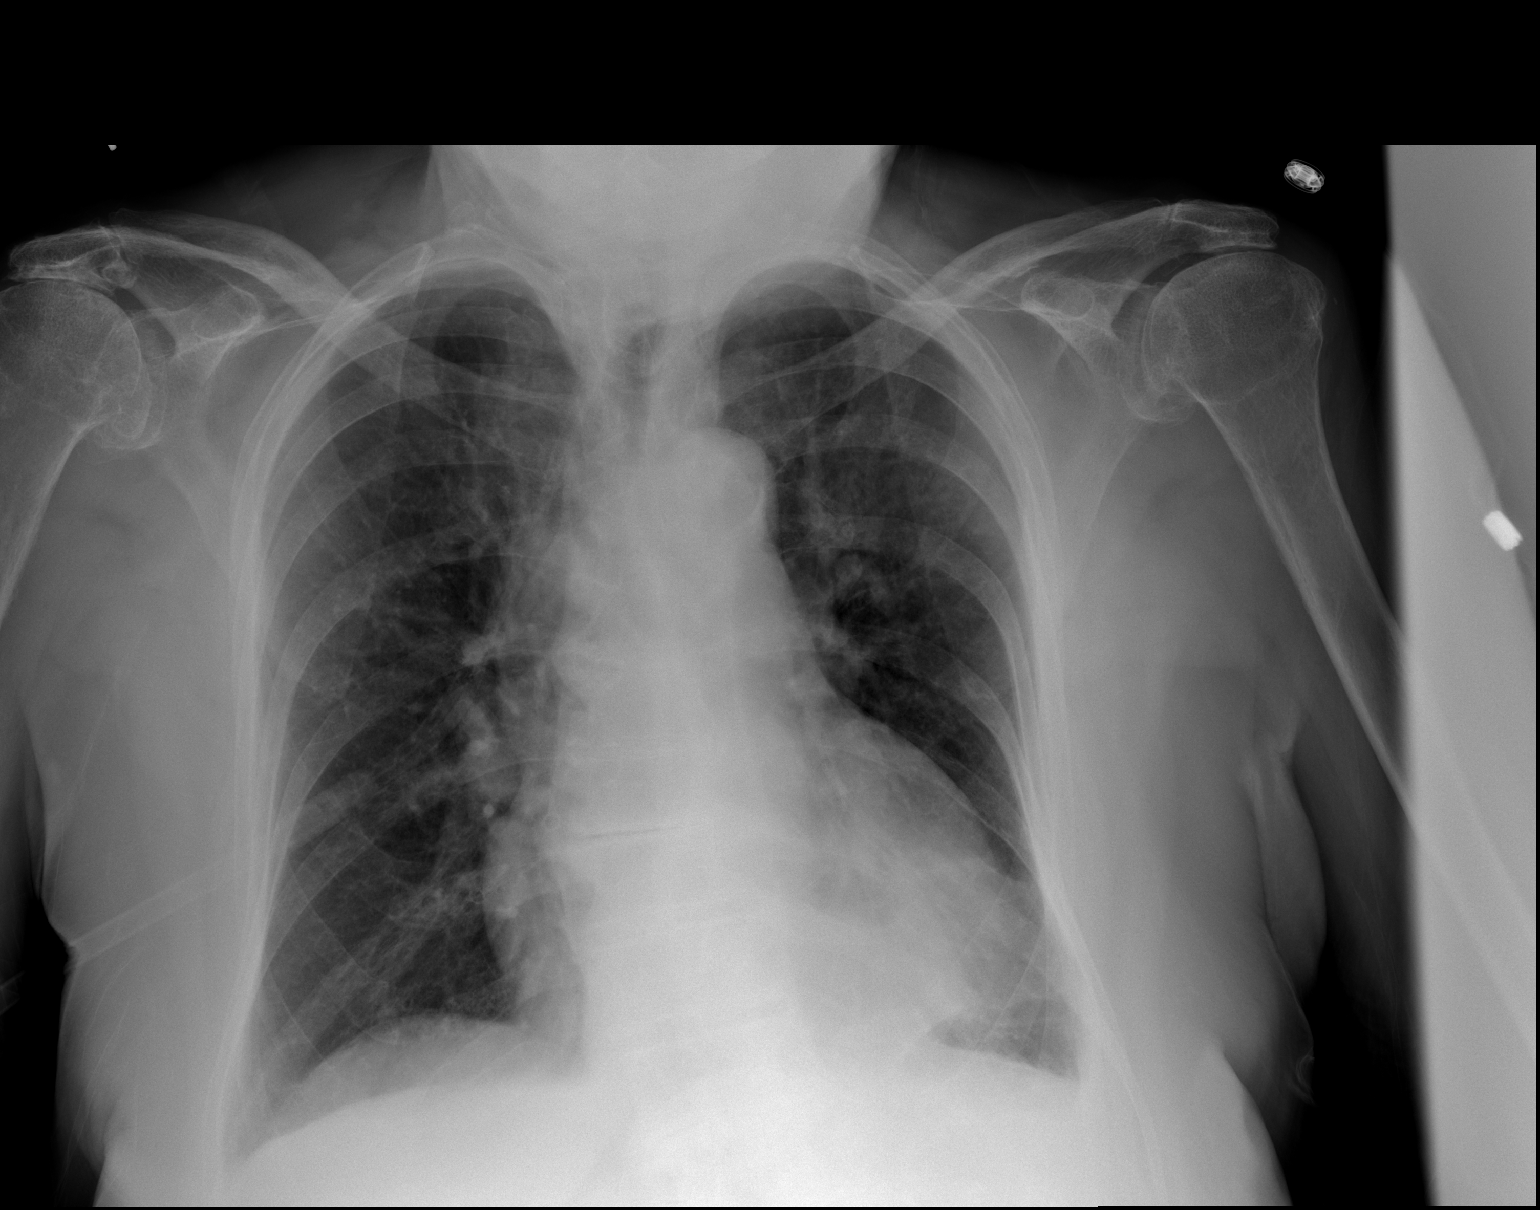

[2 of 2 positions shown; findings below may reference images not displayed]

FINDINGS: Cardiomegaly is identified.
A moderate to large hiatal hernia is present.
Left basilar atelectasis/scarring is noted.

There is no evidence of focal airspace disease, pulmonary edema,
suspicious pulmonary nodule/mass, pleural effusion, or
pneumothorax.
No acute bony abnormalities are identified.
IMPRESSION: Cardiomegaly with left basilar atelectasis/scarring.

Moderate to large hiatal hernia.

## 2014-01-17 IMAGING — CR DG SHOULDER 2+V*R*
3 series · 3 of 3 positions shown · non-contrast
Comparison: None.

CLINICAL DATA: Right shoulder pain and bruising; question of fall.

RIGHT SHOULDER - 2+ VIEW

[x shoulder ap right (1 of 2)]
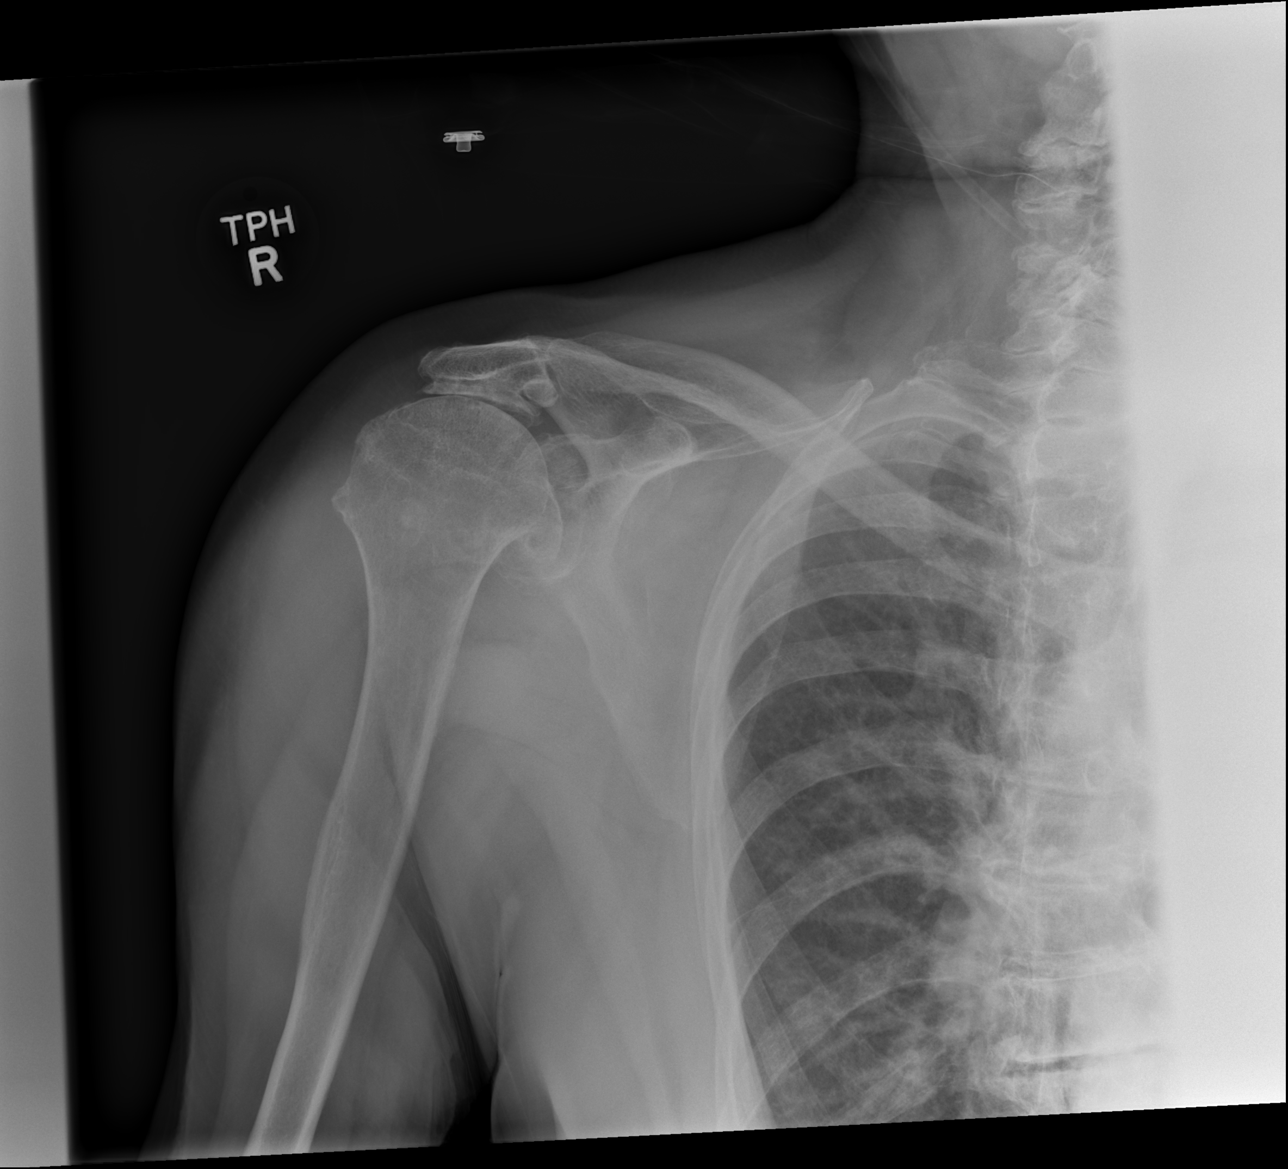

[x shoulder ap right (2 of 2)]
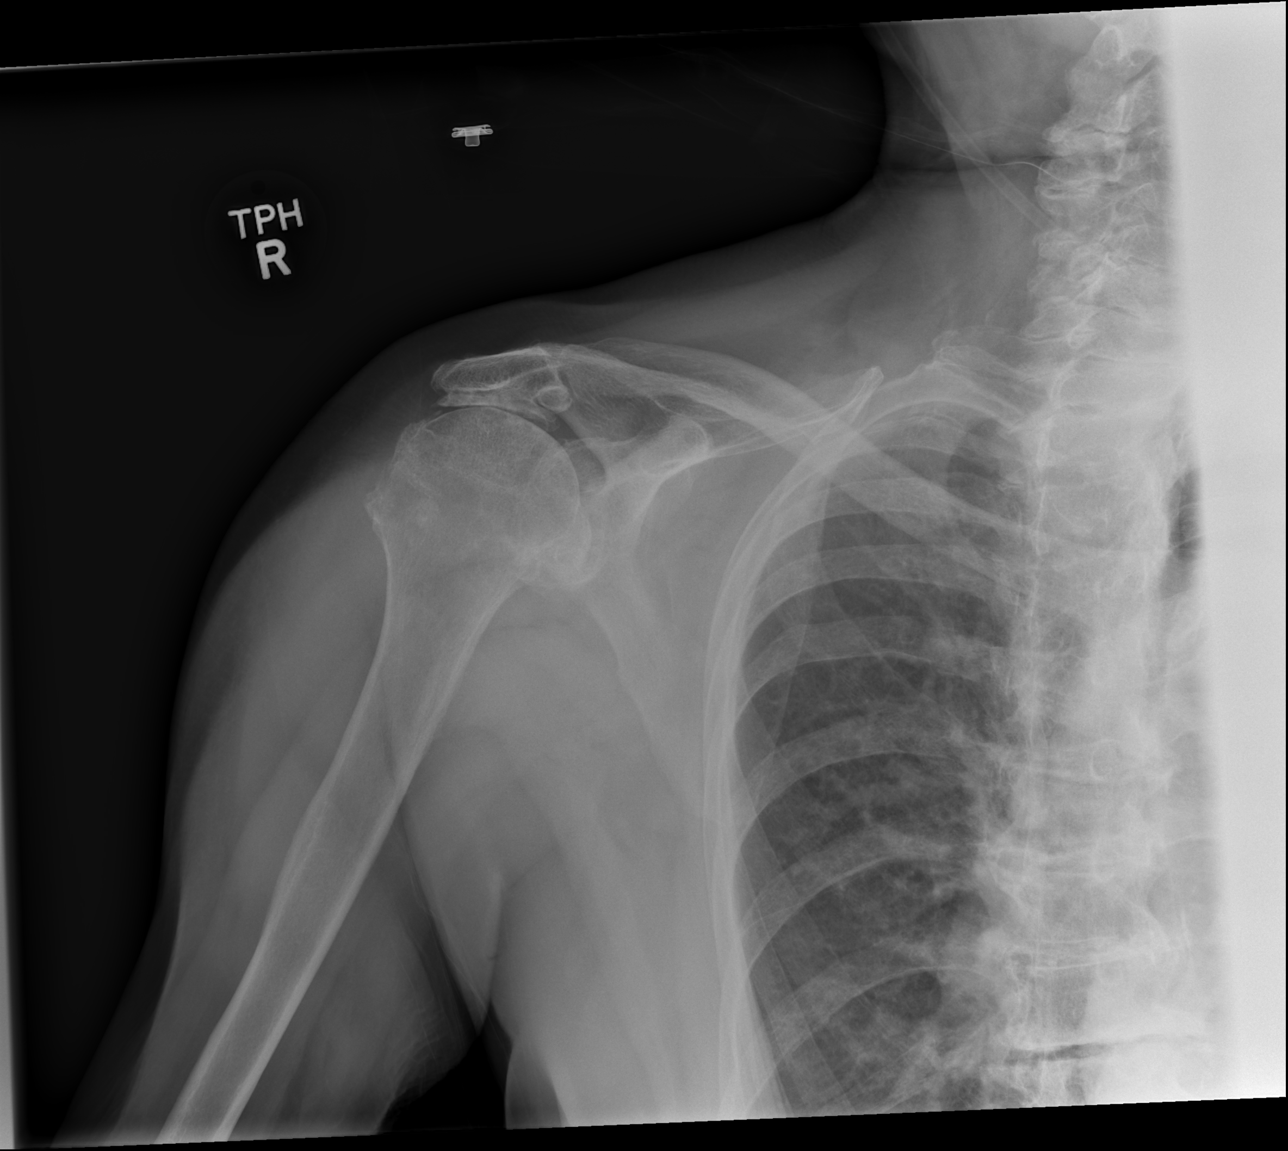

[x scapula y-view right]
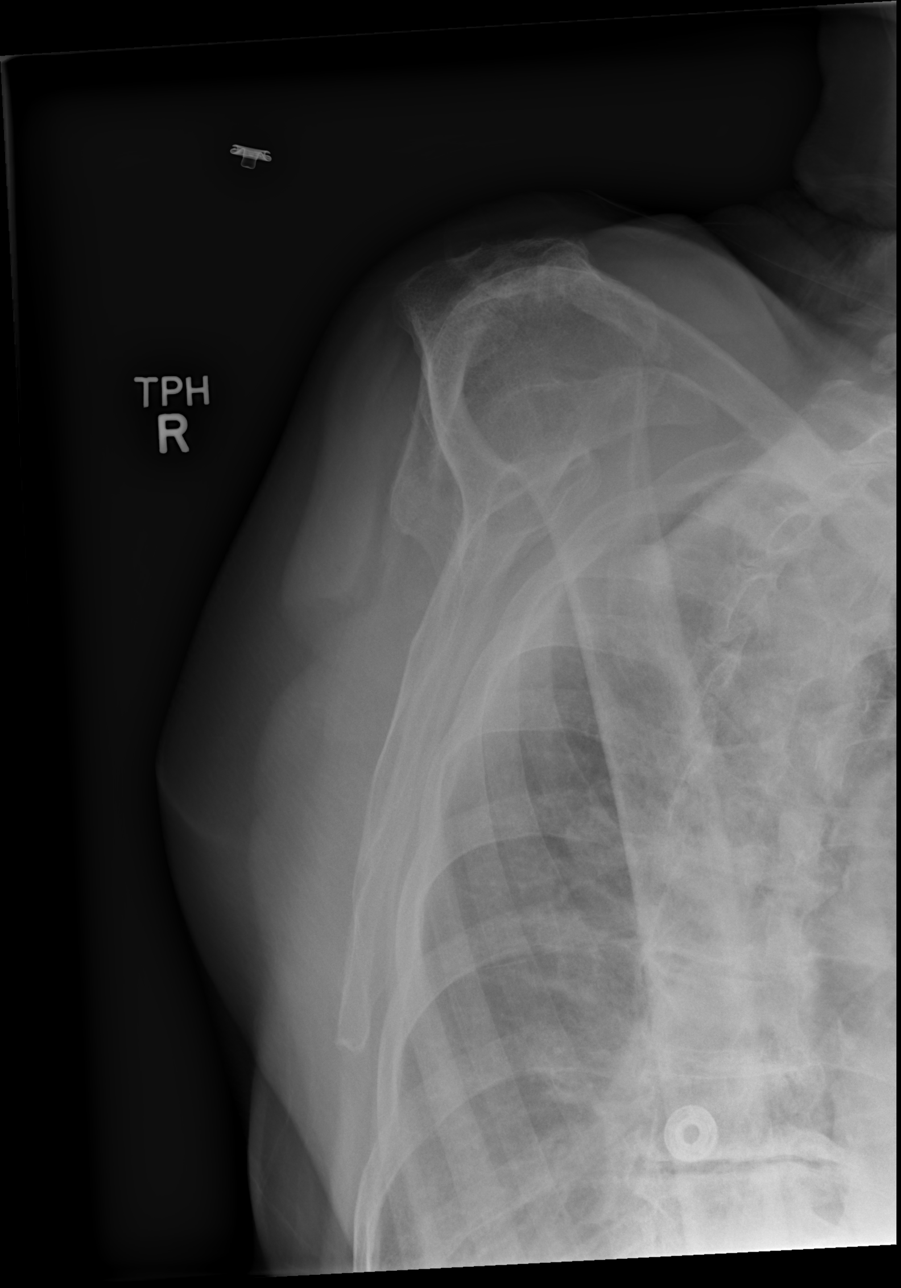

[3 of 3 positions shown; findings below may reference images not displayed]

FINDINGS: There is no evidence of fracture or dislocation.  The
right humeral head is seated within the glenoid fossa, though there
is superior subluxation of the humeral head, with associated mild
cortical irregularity and mild bony remodelling of the right
acromion.  Osteophytes are seen arising from the medial humeral
head.

The acromioclavicular joint is unremarkable in appearance.  No
significant soft tissue abnormalities are seen.  The visualized
portions of the right lung are clear.
IMPRESSION: 1.  No evidence of fracture or dislocation.
2.  Chronic degenerative changes noted at the right glenohumeral
joint, with superior subluxation of the right humeral head,
reflecting remote rotator cuff injury.

## 2014-03-16 ENCOUNTER — Telehealth: Payer: Self-pay

## 2014-03-16 NOTE — Telephone Encounter (Signed)
Patient died @ [facility] per [facility] °

## 2014-04-04 DEATH — deceased
